# Patient Record
Sex: Male | Born: 1950 | Race: White | Hispanic: No | Smoking: Light tobacco smoker
Health system: Southern US, Community
[De-identification: ages and names within clinical notes are randomized; demographics above are authoritative.]

## PROBLEM LIST (undated history)

## (undated) DIAGNOSIS — G4733 Obstructive sleep apnea (adult) (pediatric): Secondary | ICD-10-CM

## (undated) DIAGNOSIS — E785 Hyperlipidemia, unspecified: Secondary | ICD-10-CM

## (undated) DIAGNOSIS — C4491 Basal cell carcinoma of skin, unspecified: Secondary | ICD-10-CM

## (undated) DIAGNOSIS — E119 Type 2 diabetes mellitus without complications: Secondary | ICD-10-CM

## (undated) DIAGNOSIS — Z9989 Dependence on other enabling machines and devices: Secondary | ICD-10-CM

## (undated) DIAGNOSIS — I10 Essential (primary) hypertension: Secondary | ICD-10-CM

## (undated) DIAGNOSIS — E669 Obesity, unspecified: Secondary | ICD-10-CM

## (undated) DIAGNOSIS — J45909 Unspecified asthma, uncomplicated: Secondary | ICD-10-CM

## (undated) DIAGNOSIS — K219 Gastro-esophageal reflux disease without esophagitis: Secondary | ICD-10-CM

## (undated) HISTORY — PX: BASAL CELL CARCINOMA EXCISION: SHX1214

## (undated) HISTORY — DX: Unspecified asthma, uncomplicated: J45.909

---

## 2011-10-10 ENCOUNTER — Emergency Department (INDEPENDENT_AMBULATORY_CARE_PROVIDER_SITE_OTHER): Payer: Federal, State, Local not specified - PPO

## 2011-10-10 ENCOUNTER — Emergency Department (HOSPITAL_BASED_OUTPATIENT_CLINIC_OR_DEPARTMENT_OTHER)
Admission: EM | Admit: 2011-10-10 | Discharge: 2011-10-10 | Disposition: A | Discharge status: Home / Self Care | Payer: Federal, State, Local not specified - PPO | Attending: Emergency Medicine | Admitting: Emergency Medicine

## 2011-10-10 ENCOUNTER — Encounter (HOSPITAL_BASED_OUTPATIENT_CLINIC_OR_DEPARTMENT_OTHER): Payer: Self-pay | Admitting: *Deleted

## 2011-10-10 DIAGNOSIS — J45909 Unspecified asthma, uncomplicated: Secondary | ICD-10-CM | POA: Insufficient documentation

## 2011-10-10 DIAGNOSIS — Z85828 Personal history of other malignant neoplasm of skin: Secondary | ICD-10-CM | POA: Insufficient documentation

## 2011-10-10 DIAGNOSIS — K219 Gastro-esophageal reflux disease without esophagitis: Secondary | ICD-10-CM | POA: Insufficient documentation

## 2011-10-10 DIAGNOSIS — I10 Essential (primary) hypertension: Secondary | ICD-10-CM | POA: Insufficient documentation

## 2011-10-10 DIAGNOSIS — R062 Wheezing: Secondary | ICD-10-CM

## 2011-10-10 DIAGNOSIS — R0602 Shortness of breath: Secondary | ICD-10-CM

## 2011-10-10 HISTORY — DX: Essential (primary) hypertension: I10

## 2011-10-10 HISTORY — DX: Gastro-esophageal reflux disease without esophagitis: K21.9

## 2011-10-10 MED ORDER — DOXYCYCLINE MONOHYDRATE 150 MG PO CAPS: 1.0000 | ORAL_CAPSULE | Freq: Two times a day (BID) | ORAL | Status: DC

## 2011-10-10 MED ORDER — PREDNISONE 10 MG PO TABS: 20.0000 mg | ORAL_TABLET | Freq: Every day | ORAL | Status: DC

## 2011-10-10 MED ORDER — ALBUTEROL SULFATE (5 MG/ML) 0.5% IN NEBU
INHALATION_SOLUTION | RESPIRATORY_TRACT | Status: AC
  Administered 2011-10-10: 5 mg
  Filled 2011-10-10: qty 1

## 2011-10-10 MED ORDER — IPRATROPIUM BROMIDE 0.02 % IN SOLN
RESPIRATORY_TRACT | Status: AC
  Administered 2011-10-10: 0.5 mg
  Filled 2011-10-10: qty 2.5

## 2011-10-10 MED ORDER — ALBUTEROL SULFATE HFA 108 (90 BASE) MCG/ACT IN AERS
INHALATION_SPRAY | RESPIRATORY_TRACT | Status: AC
  Administered 2011-10-10: 2 via RESPIRATORY_TRACT
  Filled 2011-10-10: qty 6.7

## 2011-10-10 MED ORDER — PREDNISONE 50 MG PO TABS
60.0000 mg | ORAL_TABLET | Freq: Once | ORAL | Status: AC
  Administered 2011-10-10: 60 mg via ORAL
  Filled 2011-10-10: qty 1

## 2011-10-10 MED ORDER — DOXYCYCLINE HYCLATE 100 MG PO TABS
100.0000 mg | ORAL_TABLET | Freq: Once | ORAL | Status: AC
  Administered 2011-10-10: 100 mg via ORAL
  Filled 2011-10-10: qty 1

## 2011-10-10 NOTE — ED Notes (Signed)
Pt reports sick since Tuesday with fever, cough, wheezing and SOB- air travel x 2 in past week- uses cpap at night

## 2011-10-10 NOTE — ED Provider Notes (Signed)
History   This chart was scribed for Hilario Quarry, MD by Charolett Bumpers . The patient was seen in room MH05/MH05.     CSN: [id number]  Arrival date & time 10/10/11  1525   First MD Initiated Contact with Patient 10/10/11 1551      Chief Complaint  Patient presents with  . Shortness of Breath    (Consider location/radiation/quality/duration/timing/severity/associated sxs/prior treatment) HPI Tommy Parker is a 61 y.o. male who presents to the Emergency Department complaining of constant, moderate wheezing with associated cough, and fever for the past week. Patient reports that he has travel by air X2 in the past week as well. Patient denies smoking or a h/o smoking. Patient reports a h/o wheezing. Patient states that he has a productive cough with yellow sputum.Patient denies any h/o blood clots. Patient reports a h/o HTN and sleep apnea in which he uses a CPAP at night. Patient received a breathing treatment here in ED and patient denies any change.    PCP: Dr. Tommi Emery  Past Medical History  Diagnosis Date  . Hypertension   . Skin cancer   . Acid reflux   . Sleep apnea     History reviewed. No pertinent past surgical history.  History reviewed. No pertinent family history.  History  Substance Use Topics  . Smoking status: Current Everyday Smoker -- 0 years    Types: Cigars  . Smokeless tobacco: Current User    Types: Chew  . Alcohol Use: Not on file      Review of Systems  Constitutional: Positive for fever.  Respiratory: Positive for cough and wheezing.   Cardiovascular: Negative for chest pain.  Gastrointestinal: Negative for nausea and vomiting.  All other systems reviewed and are negative.    Allergies  Review of patient's allergies indicates no known allergies.  Home Medications   Current Outpatient Rx  Name Route Sig Dispense Refill  . CITALOPRAM HYDROBROMIDE 20 MG PO TABS Oral Take 20 mg by mouth daily.    Marland Kitchen LANSOPRAZOLE 15 MG PO CPDR Oral  Take 15 mg by mouth daily.    Marland Kitchen LISINOPRIL-HYDROCHLOROTHIAZIDE 20-12.5 MG PO TABS Oral Take 1 tablet by mouth daily.    Marland Kitchen MONTELUKAST SODIUM 10 MG PO TABS Oral Take 10 mg by mouth at bedtime.    Marland Kitchen NADOLOL 40 MG PO TABS Oral Take 40 mg by mouth daily.    Marland Kitchen NALTREXONE HCL 50 MG PO TABS Oral Take 50 mg by mouth daily.    Marland Kitchen FISH OIL PO Oral Take 1 capsule by mouth daily.      BP 130/85  Pulse 75  Temp(Src) 98.3 F (36.8 C) (Oral)  Resp 20  Ht 5\' 7"  (1.702 m)  Wt 200 lb (90.719 kg)  BMI 31.32 kg/m2  SpO2 96%  Physical Exam  Nursing note and vitals reviewed. Constitutional: He is oriented to person, place, and time. He appears well-developed and well-nourished. No distress.  HENT:  Head: Normocephalic and atraumatic.  Right Ear: External ear normal.  Left Ear: External ear normal.  Nose: Nose normal.  Eyes: EOM are normal. Pupils are equal, round, and reactive to light.  Neck: Normal range of motion. Neck supple. No tracheal deviation present.  Cardiovascular: Normal rate, regular rhythm and normal heart sounds.   Pulmonary/Chest: Effort normal. No respiratory distress. He has wheezes.       Expiratory wheezing.   Abdominal: Soft. Bowel sounds are normal. He exhibits no distension. There is no tenderness.  Musculoskeletal:  Normal range of motion. He exhibits no edema.  Neurological: He is alert and oriented to person, place, and time. No sensory deficit.  Skin: Skin is warm and dry.  Psychiatric: He has a normal mood and affect. His behavior is normal.    ED Course  Procedures (including critical care time)  DIAGNOSTIC STUDIES: Oxygen Saturation is 97% on room air, normal by my interpretation.    COORDINATION OF CARE:  1538: Medication Orders: Ipratropium (Atrovent) 0.02% nebulizer solution; Albuterol (Proventil) (5 mg/mL) 0.5% nebulizer solution.  1610: Discussed planned course of treatment with the patient who is agreeable at this time.  1624: Medication Orders: Albuterol  (Proventil) (5 mg/mL) 0.5% nebulizer solution.  1630: Medication Orders: Prednisone (Deltasone) tablet 60 mg-once; Doxycycline (Vibra-tabs) tablet 100 mg-once.   Labs Reviewed - No data to display Dg Chest 2 View  10/10/2011  *RADIOLOGY REPORT*  Clinical Data: Short of breath, wheezing  CHEST - 2 VIEW  Comparison: Chest radiograph 05/31/2007  Findings: Normal mediastinum and cardiac silhouette.  Normal pulmonary  vasculature.  No evidence of effusion, infiltrate, or pneumothorax.  No acute bony abnormality.  Mild bronchitic change centrally is similar to prior.  Calcified granuloma in the right upper lobe is unchanged from prior.  IMPRESSION: No acute cardiopulmonary process.  Original Report Authenticated By: Genevive Bi, M.D.     No diagnosis found.    MDM  I personally performed the services described in this documentation, which was scribed in my presence. The recorded information has been reviewed and considered.       Hilario Quarry, MD 10/10/11 [phone]

## 2011-10-10 NOTE — Discharge Instructions (Signed)

## 2013-01-02 ENCOUNTER — Other Ambulatory Visit (HOSPITAL_BASED_OUTPATIENT_CLINIC_OR_DEPARTMENT_OTHER): Payer: Self-pay | Admitting: Family Medicine

## 2013-01-02 ENCOUNTER — Ambulatory Visit (HOSPITAL_BASED_OUTPATIENT_CLINIC_OR_DEPARTMENT_OTHER)
Admission: RE | Admit: 2013-01-02 | Discharge: 2013-01-02 | Disposition: A | Discharge status: Home / Self Care | Payer: Federal, State, Local not specified - PPO | Source: Ambulatory Visit | Attending: Family Medicine | Admitting: Family Medicine

## 2013-01-02 DIAGNOSIS — M719 Bursopathy, unspecified: Secondary | ICD-10-CM | POA: Insufficient documentation

## 2013-01-02 DIAGNOSIS — M25511 Pain in right shoulder: Secondary | ICD-10-CM

## 2013-01-02 DIAGNOSIS — M24119 Other articular cartilage disorders, unspecified shoulder: Secondary | ICD-10-CM | POA: Insufficient documentation

## 2013-01-02 DIAGNOSIS — M25419 Effusion, unspecified shoulder: Secondary | ICD-10-CM | POA: Insufficient documentation

## 2013-01-02 DIAGNOSIS — S43439A Superior glenoid labrum lesion of unspecified shoulder, initial encounter: Secondary | ICD-10-CM | POA: Insufficient documentation

## 2013-01-02 DIAGNOSIS — X58XXXA Exposure to other specified factors, initial encounter: Secondary | ICD-10-CM | POA: Insufficient documentation

## 2013-01-02 DIAGNOSIS — M949 Disorder of cartilage, unspecified: Secondary | ICD-10-CM | POA: Insufficient documentation

## 2013-01-02 DIAGNOSIS — M899 Disorder of bone, unspecified: Secondary | ICD-10-CM | POA: Insufficient documentation

## 2013-01-02 DIAGNOSIS — M75100 Unspecified rotator cuff tear or rupture of unspecified shoulder, not specified as traumatic: Secondary | ICD-10-CM | POA: Insufficient documentation

## 2013-01-02 DIAGNOSIS — S46819A Strain of other muscles, fascia and tendons at shoulder and upper arm level, unspecified arm, initial encounter: Secondary | ICD-10-CM | POA: Insufficient documentation

## 2013-01-02 DIAGNOSIS — IMO0002 Reserved for concepts with insufficient information to code with codable children: Secondary | ICD-10-CM | POA: Insufficient documentation

## 2013-01-02 DIAGNOSIS — M19019 Primary osteoarthritis, unspecified shoulder: Secondary | ICD-10-CM | POA: Insufficient documentation

## 2013-01-02 DIAGNOSIS — M67919 Unspecified disorder of synovium and tendon, unspecified shoulder: Secondary | ICD-10-CM | POA: Insufficient documentation

## 2013-03-30 ENCOUNTER — Ambulatory Visit: Payer: Self-pay

## 2013-05-02 ENCOUNTER — Ambulatory Visit: Payer: Self-pay

## 2013-05-31 HISTORY — PX: CATARACT EXTRACTION W/ INTRAOCULAR LENS  IMPLANT, BILATERAL: SHX1307

## 2013-08-09 ENCOUNTER — Other Ambulatory Visit: Payer: Self-pay | Admitting: Physician Assistant

## 2013-08-09 ENCOUNTER — Ambulatory Visit
Admission: RE | Admit: 2013-08-09 | Discharge: 2013-08-09 | Disposition: A | Discharge status: Home / Self Care | Payer: Federal, State, Local not specified - PPO | Source: Ambulatory Visit | Attending: Physician Assistant | Admitting: Physician Assistant

## 2013-08-09 DIAGNOSIS — R109 Unspecified abdominal pain: Secondary | ICD-10-CM

## 2013-08-09 DIAGNOSIS — R509 Fever, unspecified: Secondary | ICD-10-CM

## 2013-08-09 DIAGNOSIS — K921 Melena: Secondary | ICD-10-CM

## 2013-08-09 MED ORDER — IOHEXOL 300 MG/ML  SOLN
125.0000 mL | Freq: Once | INTRAMUSCULAR | Status: AC | PRN
  Administered 2013-08-09: 125 mL via INTRAVENOUS

## 2014-02-21 ENCOUNTER — Other Ambulatory Visit: Payer: Self-pay | Admitting: Physician Assistant

## 2014-02-21 DIAGNOSIS — R109 Unspecified abdominal pain: Secondary | ICD-10-CM

## 2014-02-22 ENCOUNTER — Ambulatory Visit (INDEPENDENT_AMBULATORY_CARE_PROVIDER_SITE_OTHER): Payer: Federal, State, Local not specified - PPO

## 2014-02-22 DIAGNOSIS — R911 Solitary pulmonary nodule: Secondary | ICD-10-CM

## 2014-02-22 DIAGNOSIS — R109 Unspecified abdominal pain: Secondary | ICD-10-CM

## 2014-02-22 DIAGNOSIS — K7689 Other specified diseases of liver: Secondary | ICD-10-CM

## 2014-02-22 MED ORDER — IOHEXOL 300 MG/ML  SOLN
100.0000 mL | Freq: Once | INTRAMUSCULAR | Status: AC | PRN
  Administered 2014-02-22: 100 mL via INTRAVENOUS

## 2014-02-24 ENCOUNTER — Emergency Department (HOSPITAL_COMMUNITY)
Admission: EM | Admit: 2014-02-24 | Discharge: 2014-02-25 | Disposition: A | Discharge status: Home / Self Care | Payer: Federal, State, Local not specified - PPO | Attending: Emergency Medicine | Admitting: Emergency Medicine

## 2014-02-24 ENCOUNTER — Encounter (HOSPITAL_COMMUNITY): Payer: Self-pay | Admitting: Emergency Medicine

## 2014-02-24 ENCOUNTER — Emergency Department (HOSPITAL_COMMUNITY): Payer: Federal, State, Local not specified - PPO

## 2014-02-24 DIAGNOSIS — Z792 Long term (current) use of antibiotics: Secondary | ICD-10-CM | POA: Insufficient documentation

## 2014-02-24 DIAGNOSIS — J45909 Unspecified asthma, uncomplicated: Secondary | ICD-10-CM | POA: Insufficient documentation

## 2014-02-24 DIAGNOSIS — K219 Gastro-esophageal reflux disease without esophagitis: Secondary | ICD-10-CM | POA: Insufficient documentation

## 2014-02-24 DIAGNOSIS — Z85828 Personal history of other malignant neoplasm of skin: Secondary | ICD-10-CM | POA: Diagnosis not present

## 2014-02-24 DIAGNOSIS — E119 Type 2 diabetes mellitus without complications: Secondary | ICD-10-CM | POA: Diagnosis not present

## 2014-02-24 DIAGNOSIS — F17200 Nicotine dependence, unspecified, uncomplicated: Secondary | ICD-10-CM | POA: Diagnosis not present

## 2014-02-24 DIAGNOSIS — IMO0002 Reserved for concepts with insufficient information to code with codable children: Secondary | ICD-10-CM | POA: Insufficient documentation

## 2014-02-24 DIAGNOSIS — K8000 Calculus of gallbladder with acute cholecystitis without obstruction: Secondary | ICD-10-CM | POA: Diagnosis not present

## 2014-02-24 DIAGNOSIS — R1011 Right upper quadrant pain: Secondary | ICD-10-CM | POA: Insufficient documentation

## 2014-02-24 DIAGNOSIS — K8020 Calculus of gallbladder without cholecystitis without obstruction: Secondary | ICD-10-CM | POA: Diagnosis not present

## 2014-02-24 DIAGNOSIS — Z79899 Other long term (current) drug therapy: Secondary | ICD-10-CM | POA: Insufficient documentation

## 2014-02-24 DIAGNOSIS — I10 Essential (primary) hypertension: Secondary | ICD-10-CM | POA: Insufficient documentation

## 2014-02-24 LAB — COMPREHENSIVE METABOLIC PANEL
ALBUMIN: 3.6 g/dL (ref 3.5–5.2)
ALK PHOS: 88 U/L (ref 39–117)
ALT: 74 U/L — ABNORMAL HIGH (ref 0–53)
AST: 43 U/L — AB (ref 0–37)
Anion gap: 14 (ref 5–15)
BILIRUBIN TOTAL: 0.3 mg/dL (ref 0.3–1.2)
BUN: 15 mg/dL (ref 6–23)
CHLORIDE: 100 meq/L (ref 96–112)
CO2: 24 mEq/L (ref 19–32)
Calcium: 9.3 mg/dL (ref 8.4–10.5)
Creatinine, Ser: 1.13 mg/dL (ref 0.50–1.35)
GFR calc Af Amer: 79 mL/min — ABNORMAL LOW (ref >90)
GFR calc non Af Amer: 68 mL/min — ABNORMAL LOW (ref >90)
Glucose, Bld: 192 mg/dL — ABNORMAL HIGH (ref 70–99)
POTASSIUM: 4.4 meq/L (ref 3.7–5.3)
Sodium: 138 mEq/L (ref 137–147)
Total Protein: 8.1 g/dL (ref 6.0–8.3)

## 2014-02-24 LAB — CBC WITH DIFFERENTIAL/PLATELET
BASOS ABS: 0 10*3/uL (ref 0.0–0.1)
BASOS PCT: 0 % (ref 0–1)
Eosinophils Absolute: 0.2 10*3/uL (ref 0.0–0.7)
Eosinophils Relative: 2 % (ref 0–5)
HCT: 44.4 % (ref 39.0–52.0)
HEMOGLOBIN: 15.1 g/dL (ref 13.0–17.0)
LYMPHS PCT: 38 % (ref 12–46)
Lymphs Abs: 4.2 10*3/uL — ABNORMAL HIGH (ref 0.7–4.0)
MCH: 31.3 pg (ref 26.0–34.0)
MCHC: 34 g/dL (ref 30.0–36.0)
MCV: 91.9 fL (ref 78.0–100.0)
MONOS PCT: 9 % (ref 3–12)
Monocytes Absolute: 1 10*3/uL (ref 0.1–1.0)
NEUTROS ABS: 5.5 10*3/uL (ref 1.7–7.7)
NEUTROS PCT: 50 % (ref 43–77)
Platelets: 246 10*3/uL (ref 150–400)
RBC: 4.83 MIL/uL (ref 4.22–5.81)
RDW: 12.2 % (ref 11.5–15.5)
WBC: 11 10*3/uL — AB (ref 4.0–10.5)

## 2014-02-24 LAB — LIPASE, BLOOD: Lipase: 55 U/L (ref 11–59)

## 2014-02-24 MED ORDER — OXYCODONE-ACETAMINOPHEN 5-325 MG PO TABS
2.0000 | ORAL_TABLET | Freq: Once | ORAL | Status: AC
  Administered 2014-02-24: 2 via ORAL
  Filled 2014-02-24: qty 2

## 2014-02-24 MED ORDER — OXYCODONE-ACETAMINOPHEN 5-325 MG PO TABS: 2.0000 | ORAL_TABLET | Freq: Four times a day (QID) | ORAL | Status: DC | PRN

## 2014-02-24 MED ORDER — ONDANSETRON HCL 4 MG/2ML IJ SOLN
4.0000 mg | Freq: Once | INTRAMUSCULAR | Status: AC
  Administered 2014-02-24: 4 mg via INTRAVENOUS
  Filled 2014-02-24: qty 2

## 2014-02-24 MED ORDER — MORPHINE SULFATE 4 MG/ML IJ SOLN
4.0000 mg | Freq: Once | INTRAMUSCULAR | Status: AC
  Administered 2014-02-24: 4 mg via INTRAVENOUS
  Filled 2014-02-24: qty 1

## 2014-02-24 NOTE — ED Notes (Signed)
Patient returned from Ultrasound. 

## 2014-02-24 NOTE — ED Notes (Signed)
Pt reports last BM was yesterday. Pt states he normally has one everyday.

## 2014-02-24 NOTE — Discharge Instructions (Signed)

## 2014-02-24 NOTE — ED Provider Notes (Signed)
CSN: [id number]     Arrival date & time 02/24/14  2033 History   First MD Initiated Contact with Patient 02/24/14 2111     Chief Complaint  Patient presents with  . Abdominal Pain     (Consider location/radiation/quality/duration/timing/severity/associated sxs/prior Treatment) HPI Comments: Patient presents emergency department with chief complaint of right upper quadrant abdominal pain. He states the pain started a week ago. States that he was seen by his primary care provider, and had a CT scan performed. There was some concern of gallbladder inflammation, but no evidence of acute cholecystitis. Patient was prescribed tramadol, which she has been taking with no relief of his pain. He states pain is worsening. He denies any fevers, chills, nausea, vomiting, or diarrhea. There no aggravating or alleviating factors. Patient denies any history of abdominal surgery. He states that his pain is severe, so he came for reevaluation.  The history is provided by the patient. No language interpreter was used.    Past Medical History  Diagnosis Date  . Hypertension   . Skin cancer   . Acid reflux   . Sleep apnea   . Diabetes mellitus without complication   . Asthma    History reviewed. No pertinent past surgical history. History reviewed. No pertinent family history. History  Substance Use Topics  . Smoking status: Light Tobacco Smoker -- 0 years    Types: Cigars  . Smokeless tobacco: Current User    Types: Chew  . Alcohol Use: No    Review of Systems  All other systems reviewed and are negative.     Allergies  Review of patient's allergies indicates no known allergies.  Home Medications   Prior to Admission medications   Medication Sig Start Date End Date Taking? Authorizing Provider  citalopram (CELEXA) 20 MG tablet Take 20 mg by mouth daily.    Historical Provider, MD  Doxycycline Monohydrate 150 MG CAPS Take 1 capsule (150 mg total) by mouth 2 (two) times daily. 10/10/11    Shaune Pollack, MD  lansoprazole (PREVACID) 15 MG capsule Take 15 mg by mouth daily.    Historical Provider, MD  lisinopril-hydrochlorothiazide (PRINZIDE,ZESTORETIC) 20-12.5 MG per tablet Take 1 tablet by mouth daily.    Historical Provider, MD  montelukast (SINGULAIR) 10 MG tablet Take 10 mg by mouth at bedtime.    Historical Provider, MD  nadolol (CORGARD) 40 MG tablet Take 40 mg by mouth daily.    Historical Provider, MD  naltrexone (DEPADE) 50 MG tablet Take 50 mg by mouth daily.    Historical Provider, MD  Omega-3 Fatty Acids (FISH OIL PO) Take 1 capsule by mouth daily.    Historical Provider, MD  predniSONE (DELTASONE) 10 MG tablet Take 2 tablets (20 mg total) by mouth daily. 10/10/11   Shaune Pollack, MD   BP 165/83  Pulse 97  Temp(Src) 98.6 F (37 C) (Oral)  Resp 18  SpO2 93% Physical Exam  Nursing note and vitals reviewed. Constitutional: He is oriented to person, place, and time. He appears well-developed and well-nourished.  HENT:  Head: Normocephalic and atraumatic.  Eyes: Conjunctivae and EOM are normal. Pupils are equal, round, and reactive to light. Right eye exhibits no discharge. Left eye exhibits no discharge. No scleral icterus.  Neck: Normal range of motion. Neck supple. No JVD present.  Cardiovascular: Normal rate, regular rhythm and normal heart sounds.  Exam reveals no gallop and no friction rub.   No murmur heard. Pulmonary/Chest: Effort normal and breath sounds normal. No  respiratory distress. He has no wheezes. He has no rales. He exhibits no tenderness.  Abdominal: Soft. He exhibits no distension and no mass. There is tenderness. There is no rebound and no guarding.  Right upper quadrant abdominal tenderness to palpation, no Murphy's sign, no left-sided abdominal tenderness, no lower abdominal tenderness  Musculoskeletal: Normal range of motion. He exhibits no edema and no tenderness.  Neurological: He is alert and oriented to person, place, and time.  Skin:  Skin is warm and dry.  Psychiatric: He has a normal mood and affect. His behavior is normal. Judgment and thought content normal.    ED Course  Procedures (including critical care time) Results for orders placed during the hospital encounter of 02/24/14  CBC WITH DIFFERENTIAL      Result Value Ref Range   WBC 11.0 (*) 4.0 - 10.5 K/uL   RBC 4.83  4.22 - 5.81 MIL/uL   Hemoglobin 15.1  13.0 - 17.0 g/dL   HCT 44.4  39.0 - 52.0 %   MCV 91.9  78.0 - 100.0 fL   MCH 31.3  26.0 - 34.0 pg   MCHC 34.0  30.0 - 36.0 g/dL   RDW 12.2  11.5 - 15.5 %   Platelets 246  150 - 400 K/uL   Neutrophils Relative % 50  43 - 77 %   Neutro Abs 5.5  1.7 - 7.7 K/uL   Lymphocytes Relative 38  12 - 46 %   Lymphs Abs 4.2 (*) 0.7 - 4.0 K/uL   Monocytes Relative 9  3 - 12 %   Monocytes Absolute 1.0  0.1 - 1.0 K/uL   Eosinophils Relative 2  0 - 5 %   Eosinophils Absolute 0.2  0.0 - 0.7 K/uL   Basophils Relative 0  0 - 1 %   Basophils Absolute 0.0  0.0 - 0.1 K/uL  COMPREHENSIVE METABOLIC PANEL      Result Value Ref Range   Sodium 138  137 - 147 mEq/L   Potassium 4.4  3.7 - 5.3 mEq/L   Chloride 100  96 - 112 mEq/L   CO2 24  19 - 32 mEq/L   Glucose, Bld 192 (*) 70 - 99 mg/dL   BUN 15  6 - 23 mg/dL   Creatinine, Ser 1.13  0.50 - 1.35 mg/dL   Calcium 9.3  8.4 - 10.5 mg/dL   Total Protein 8.1  6.0 - 8.3 g/dL   Albumin 3.6  3.5 - 5.2 g/dL   AST 43 (*) 0 - 37 U/L   ALT 74 (*) 0 - 53 U/L   Alkaline Phosphatase 88  39 - 117 U/L   Total Bilirubin 0.3  0.3 - 1.2 mg/dL   GFR calc non Af Amer 68 (*) >90 mL/min   GFR calc Af Amer 79 (*) >90 mL/min   Anion gap 14  5 - 15  LIPASE, BLOOD      Result Value Ref Range   Lipase 55  11 - 59 U/L   US Abdomen Complete  02/24/2014   CLINICAL DATA:  Right upper quadrant pain.  EXAM: ULTRASOUND ABDOMEN COMPLETE  COMPARISON:  CT abdomen and pelvis 02/22/2014  FINDINGS: Gallbladder:  Diffuse echogenic material demonstrated in the gallbladder consistent with sludge and small  stones. No gallbladder wall thickening. Murphy's sign is negative.  Common bile duct:  Diameter: 6.5 mm, upper limits of normal.  Liver:  Diffusely increased hepatic echotexture suggesting mild fatty infiltration. No focal lesions.  IVC:  No  abnormality visualized.  Pancreas:  Limited visualization.  Visualized portion appears unremarkable.  Spleen:  Size and appearance within normal limits.  Right Kidney:  Length: 11.3 cm. Echogenicity within normal limits. No mass or hydronephrosis visualized.  Left Kidney:  Length: 12.2 cm. Echogenicity within normal limits. No mass or hydronephrosis visualized.  Abdominal aorta:  No aneurysm visualized.  Other findings:  None.  IMPRESSION: Sludge and small stones in the gallbladder. No gallbladder wall thickening. Murphy's sign negative.   Electronically Signed   By: Lucienne Capers M.D.   On: 02/24/2014 23:14   Ct Abdomen Pelvis W Contrast  02/22/2014   CLINICAL DATA:  Several day history of abdominal pain. Past medical history of pancreatitis.  EXAM: CT ABDOMEN AND PELVIS WITH CONTRAST  TECHNIQUE: Multidetector CT imaging of the abdomen and pelvis was performed using the standard protocol following bolus administration of intravenous contrast.  CONTRAST:  115mL OMNIPAQUE IOHEXOL 300 MG/ML  SOLN  COMPARISON:  Prior CT abdomen/ pelvis 08/09/2013  FINDINGS: Lower Chest: 3 mm subpleural pulmonary nodules in the right lower lobe (5 and 6 of series 3) remain unchanged dating back to March of 2015. Otherwise, the visualized lower lungs are clear. Cardiac structures within normal limits for size. Unremarkable distal thoracic esophagus.  Abdomen: Unremarkable CT appearance of the stomach, duodenum, spleen, adrenal glands and pancreas. Normal hepatic contour and morphology. Diffuse hypoattenuation of the hepatic parenchyma with slight sparing adjacent to the gallbladder fossa consistent with hepatic steatosis. The gallbladder is mildly distended at 5.1 cm in diameter. Suggestion of  high attenuation in the gallbladder neck raises the possibility of stones. No definite radiopaque stones are identified. No intrahepatic biliary ductal dilatation.  Unremarkable appearance of the bilateral kidneys. No focal solid lesion, hydronephrosis or nephrolithiasis. No evidence of obstruction or focal bowel wall thickening. Normal appendix in the right lower quadrant. The terminal ileum is unremarkable. Moderate volume of formed stool throughout the colon. No free fluid or suspicious adenopathy.  Pelvis: Unremarkable bladder, prostate gland and seminal vesicles. No free fluid or suspicious adenopathy.  Bones/Soft Tissues: No acute fracture or aggressive appearing lytic or blastic osseous lesion.  Vascular: No significant atherosclerotic vascular disease, aneurysmal dilatation or acute abnormality.  IMPRESSION: 1. Nonspecific distension of the gallbladder without definite gallbladder wall thickening or pericholecystic fluid. No radiopaque cholelithiasis identified although there is slight increased density in the gallbladder neck suggesting the presence of underlying stones. If the patient's pain localizes to the right upper quadrant, consider further evaluation with abdominal ultrasound to assess for possible acute or chronic cholecystitis. 2. Moderate volume of retained stool throughout the colon. Query clinical history of constipation? 3. Hepatic steatosis. 4. Stable tiny (up to 3 mm) subpleural pulmonary nodules dating back to at least March of 2015. While multiplicity and 6 month stability is highly suggestive of a benign process, a at least 1 additional followup CT scan is recommended to ensure stability. Recommend repeat CT scan of the chest in 1 year.   Electronically Signed   By: Jacqulynn Cadet M.D.   On: 02/22/2014 10:27      EKG Interpretation None      MDM   Final diagnoses:  Right upper quadrant pain    Patient with right upper quadrant abdominal pain that started one week ago.  Seen by PCP, and had CT scan ordered. CT results remarkable for some nonspecific gallbladder distention. Patient was treated with pain medicine, and encouraged to followup next week. Patient states that his pain has remained, and  possibly slightly worsened. We'll check labs, an ultrasound.  Ultrasound is negative for cholecystitis. Will treat patient's pain, and discharged to home with PCP/surgery followup. Patient understands and agrees with plan. He is stable and ready for discharge. Pain improved in the ED.    Montine Circle, PA-C 02/24/14 2353

## 2014-02-24 NOTE — ED Notes (Signed)
Pt c/o generalized abdominal pain since last monday. Pt states he was seen by his PCP, had a CT scan on Friday, pt was told pain is probably caused by gall stones, PCP notify patient sometime this week regarding results. Pt was prescribed Tramadol by PCP, Pt's pain in unrelieved with pain prescription. Pt denies n/v/d. Denies fever at home.

## 2014-02-25 ENCOUNTER — Encounter (HOSPITAL_COMMUNITY): Payer: Self-pay | Admitting: Emergency Medicine

## 2014-02-25 ENCOUNTER — Inpatient Hospital Stay (HOSPITAL_COMMUNITY)
Admission: EM | Admit: 2014-02-25 | Discharge: 2014-02-28 | DRG: 419 | Disposition: A | Discharge status: Home / Self Care | Payer: Federal, State, Local not specified - PPO | Attending: General Surgery | Admitting: General Surgery

## 2014-02-25 DIAGNOSIS — K219 Gastro-esophageal reflux disease without esophagitis: Secondary | ICD-10-CM | POA: Diagnosis present

## 2014-02-25 DIAGNOSIS — Z85828 Personal history of other malignant neoplasm of skin: Secondary | ICD-10-CM

## 2014-02-25 DIAGNOSIS — Z794 Long term (current) use of insulin: Secondary | ICD-10-CM

## 2014-02-25 DIAGNOSIS — F17220 Nicotine dependence, chewing tobacco, uncomplicated: Secondary | ICD-10-CM | POA: Diagnosis present

## 2014-02-25 DIAGNOSIS — J45909 Unspecified asthma, uncomplicated: Secondary | ICD-10-CM | POA: Diagnosis present

## 2014-02-25 DIAGNOSIS — G4730 Sleep apnea, unspecified: Secondary | ICD-10-CM

## 2014-02-25 DIAGNOSIS — K8000 Calculus of gallbladder with acute cholecystitis without obstruction: Principal | ICD-10-CM | POA: Diagnosis present

## 2014-02-25 DIAGNOSIS — F17290 Nicotine dependence, other tobacco product, uncomplicated: Secondary | ICD-10-CM | POA: Diagnosis present

## 2014-02-25 DIAGNOSIS — E785 Hyperlipidemia, unspecified: Secondary | ICD-10-CM

## 2014-02-25 DIAGNOSIS — E119 Type 2 diabetes mellitus without complications: Secondary | ICD-10-CM

## 2014-02-25 DIAGNOSIS — I10 Essential (primary) hypertension: Secondary | ICD-10-CM

## 2014-02-25 DIAGNOSIS — E669 Obesity, unspecified: Secondary | ICD-10-CM

## 2014-02-25 DIAGNOSIS — K8020 Calculus of gallbladder without cholecystitis without obstruction: Secondary | ICD-10-CM | POA: Diagnosis present

## 2014-02-25 HISTORY — DX: Basal cell carcinoma of skin, unspecified: C44.91

## 2014-02-25 HISTORY — DX: Obstructive sleep apnea (adult) (pediatric): G47.33

## 2014-02-25 HISTORY — DX: Dependence on other enabling machines and devices: Z99.89

## 2014-02-25 HISTORY — DX: Obesity, unspecified: E66.9

## 2014-02-25 HISTORY — DX: Type 2 diabetes mellitus without complications: E11.9

## 2014-02-25 HISTORY — DX: Hyperlipidemia, unspecified: E78.5

## 2014-02-25 LAB — CBC WITH DIFFERENTIAL/PLATELET
BASOS PCT: 0 % (ref 0–1)
Basophils Absolute: 0 10*3/uL (ref 0.0–0.1)
EOS ABS: 0.2 10*3/uL (ref 0.0–0.7)
EOS PCT: 3 % (ref 0–5)
HEMATOCRIT: 42.7 % (ref 39.0–52.0)
HEMOGLOBIN: 14.6 g/dL (ref 13.0–17.0)
LYMPHS ABS: 2.6 10*3/uL (ref 0.7–4.0)
Lymphocytes Relative: 30 % (ref 12–46)
MCH: 31.5 pg (ref 26.0–34.0)
MCHC: 34.2 g/dL (ref 30.0–36.0)
MCV: 92 fL (ref 78.0–100.0)
MONO ABS: 0.9 10*3/uL (ref 0.1–1.0)
MONOS PCT: 10 % (ref 3–12)
Neutro Abs: 5.1 10*3/uL (ref 1.7–7.7)
Neutrophils Relative %: 57 % (ref 43–77)
Platelets: 212 10*3/uL (ref 150–400)
RBC: 4.64 MIL/uL (ref 4.22–5.81)
RDW: 12.5 % (ref 11.5–15.5)
WBC: 8.8 10*3/uL (ref 4.0–10.5)

## 2014-02-25 LAB — COMPREHENSIVE METABOLIC PANEL
ALK PHOS: 105 U/L (ref 39–117)
ALT: 72 U/L — ABNORMAL HIGH (ref 0–53)
ANION GAP: 13 (ref 5–15)
AST: 46 U/L — ABNORMAL HIGH (ref 0–37)
Albumin: 3.3 g/dL — ABNORMAL LOW (ref 3.5–5.2)
BUN: 13 mg/dL (ref 6–23)
CALCIUM: 9.1 mg/dL (ref 8.4–10.5)
CO2: 24 mEq/L (ref 19–32)
CREATININE: 0.85 mg/dL (ref 0.50–1.35)
Chloride: 100 mEq/L (ref 96–112)
GFR calc non Af Amer: >90 mL/min (ref >90)
GLUCOSE: 224 mg/dL — AB (ref 70–99)
POTASSIUM: 4.1 meq/L (ref 3.7–5.3)
Sodium: 137 mEq/L (ref 137–147)
TOTAL PROTEIN: 7.8 g/dL (ref 6.0–8.3)
Total Bilirubin: 0.4 mg/dL (ref 0.3–1.2)

## 2014-02-25 LAB — LIPASE, BLOOD: LIPASE: 44 U/L (ref 11–59)

## 2014-02-25 MED ORDER — POTASSIUM CHLORIDE IN NACL 20-0.9 MEQ/L-% IV SOLN
INTRAVENOUS | Status: DC
Start: 2014-02-25 — End: 2014-02-27
  Administered 2014-02-25 – 2014-02-27 (×4): via INTRAVENOUS
  Filled 2014-02-25 (×7): qty 1000

## 2014-02-25 MED ORDER — LISINOPRIL-HYDROCHLOROTHIAZIDE 20-12.5 MG PO TABS: 1.0000 | ORAL_TABLET | Freq: Every day | ORAL | Status: DC

## 2014-02-25 MED ORDER — LISINOPRIL 20 MG PO TABS
20.0000 mg | ORAL_TABLET | Freq: Every day | ORAL | Status: DC
  Administered 2014-02-27 – 2014-02-28 (×2): 20 mg via ORAL
  Filled 2014-02-25 (×3): qty 1

## 2014-02-25 MED ORDER — MORPHINE SULFATE 4 MG/ML IJ SOLN
4.0000 mg | Freq: Once | INTRAMUSCULAR | Status: AC
  Administered 2014-02-25: 4 mg via INTRAVENOUS
  Filled 2014-02-25: qty 1

## 2014-02-25 MED ORDER — NADOLOL 40 MG PO TABS
40.0000 mg | ORAL_TABLET | Freq: Every day | ORAL | Status: DC
  Administered 2014-02-26 – 2014-02-28 (×3): 40 mg via ORAL
  Filled 2014-02-25 (×3): qty 1

## 2014-02-25 MED ORDER — HYDROCHLOROTHIAZIDE 12.5 MG PO CAPS
12.5000 mg | ORAL_CAPSULE | Freq: Every day | ORAL | Status: DC
  Administered 2014-02-27 – 2014-02-28 (×2): 12.5 mg via ORAL
  Filled 2014-02-25 (×3): qty 1

## 2014-02-25 MED ORDER — AMLODIPINE BESYLATE 10 MG PO TABS
10.0000 mg | ORAL_TABLET | Freq: Every day | ORAL | Status: DC
  Administered 2014-02-27 – 2014-02-28 (×2): 10 mg via ORAL
  Filled 2014-02-25 (×3): qty 1

## 2014-02-25 MED ORDER — MONTELUKAST SODIUM 10 MG PO TABS
10.0000 mg | ORAL_TABLET | Freq: Every day | ORAL | Status: DC
  Administered 2014-02-27 – 2014-02-28 (×2): 10 mg via ORAL
  Filled 2014-02-25 (×3): qty 1

## 2014-02-25 MED ORDER — PANTOPRAZOLE SODIUM 40 MG IV SOLR
40.0000 mg | Freq: Every day | INTRAVENOUS | Status: DC
  Administered 2014-02-25 – 2014-02-27 (×3): 40 mg via INTRAVENOUS
  Filled 2014-02-25 (×4): qty 40

## 2014-02-25 MED ORDER — ONDANSETRON HCL 4 MG/2ML IJ SOLN: 4.0000 mg | Freq: Four times a day (QID) | INTRAMUSCULAR | Status: DC | PRN

## 2014-02-25 MED ORDER — MORPHINE SULFATE 4 MG/ML IJ SOLN
4.0000 mg | INTRAMUSCULAR | Status: DC | PRN
  Administered 2014-02-25 – 2014-02-26 (×3): 4 mg via INTRAVENOUS
  Filled 2014-02-25 (×3): qty 1

## 2014-02-25 NOTE — ED Notes (Signed)
Has been seen here for gallstones and given pain medication but the medicine is not working. Gen surgery PA called earlier and states Dr. Marlou Starks will see in the ED.

## 2014-02-25 NOTE — ED Provider Notes (Signed)
Pt met in ED by Dr. Marlou Starks with gen surgery who was aware pt was coming to the ED. He will be taking primary care of pt and will take to the OR for cholecystectomy. Pt was not seen primarily by myself.   Ernestina Patches, MD 02/25/14 [phone]

## 2014-02-25 NOTE — Progress Notes (Signed)
Pt has home cpap and places self on/off.  Rt will continue to monitor.

## 2014-02-25 NOTE — ED Provider Notes (Signed)
Medical screening examination/treatment/procedure(s) were performed by non-physician practitioner and as supervising physician I was immediately available for consultation/collaboration.   EKG Interpretation None       Charlesetta Shanks, MD 02/25/14 [phone]

## 2014-02-25 NOTE — ED Notes (Signed)
Discharge instructions given along with a prescription  Voiced understanding

## 2014-02-25 NOTE — H&P (Signed)
Tommy Parker is an 63 y.o. male.   Chief Complaint: abdominal pain HPI: The pt is a 63yo wm who has been having RUQ pain since last Monday. He denies nausea or vomiting but the pain has been constant and getting worse. He came to ER where u/s shows stones in gallbladder but no gallbladder wall thickening or duct dilation.  Past Medical History  Diagnosis Date  . Hypertension   . Skin cancer   . Acid reflux   . Sleep apnea   . Diabetes mellitus without complication   . Asthma     History reviewed. No pertinent past surgical history.  No family history on file. Social History:  reports that he has been smoking Cigars.  His smokeless tobacco use includes Chew. He reports that he does not drink alcohol or use illicit drugs.  Allergies: No Known Allergies   (Not in a hospital admission)  Results for orders placed during the hospital encounter of 02/24/14 (from the past 48 hour(s))  CBC WITH DIFFERENTIAL     Status: Abnormal   Collection Time    02/24/14  9:03 PM      Result Value Ref Range   WBC 11.0 (*) 4.0 - 10.5 K/uL   RBC 4.83  4.22 - 5.81 MIL/uL   Hemoglobin 15.1  13.0 - 17.0 g/dL   HCT 44.4  39.0 - 52.0 %   MCV 91.9  78.0 - 100.0 fL   MCH 31.3  26.0 - 34.0 pg   MCHC 34.0  30.0 - 36.0 g/dL   RDW 12.2  11.5 - 15.5 %   Platelets 246  150 - 400 K/uL   Neutrophils Relative % 50  43 - 77 %   Neutro Abs 5.5  1.7 - 7.7 K/uL   Lymphocytes Relative 38  12 - 46 %   Lymphs Abs 4.2 (*) 0.7 - 4.0 K/uL   Monocytes Relative 9  3 - 12 %   Monocytes Absolute 1.0  0.1 - 1.0 K/uL   Eosinophils Relative 2  0 - 5 %   Eosinophils Absolute 0.2  0.0 - 0.7 K/uL   Basophils Relative 0  0 - 1 %   Basophils Absolute 0.0  0.0 - 0.1 K/uL  COMPREHENSIVE METABOLIC PANEL     Status: Abnormal   Collection Time    02/24/14  9:03 PM      Result Value Ref Range   Sodium 138  137 - 147 mEq/L   Potassium 4.4  3.7 - 5.3 mEq/L   Chloride 100  96 - 112 mEq/L   CO2 24  19 - 32 mEq/L   Glucose, Bld 192 (*)  70 - 99 mg/dL   BUN 15  6 - 23 mg/dL   Creatinine, Ser 1.13  0.50 - 1.35 mg/dL   Calcium 9.3  8.4 - 10.5 mg/dL   Total Protein 8.1  6.0 - 8.3 g/dL   Albumin 3.6  3.5 - 5.2 g/dL   AST 43 (*) 0 - 37 U/L   ALT 74 (*) 0 - 53 U/L   Alkaline Phosphatase 88  39 - 117 U/L   Total Bilirubin 0.3  0.3 - 1.2 mg/dL   GFR calc non Af Amer 68 (*) >90 mL/min   GFR calc Af Amer 79 (*) >90 mL/min   Comment: (NOTE)     The eGFR has been calculated using the CKD EPI equation.     This calculation has not been validated in all clinical situations.  eGFR's persistently <90 mL/min signify possible Chronic Kidney     Disease.   Anion gap 14  5 - 15  LIPASE, BLOOD     Status: None   Collection Time    02/24/14  9:03 PM      Result Value Ref Range   Lipase 55  11 - 59 U/L   US Abdomen Complete  02/24/2014   CLINICAL DATA:  Right upper quadrant pain.  EXAM: ULTRASOUND ABDOMEN COMPLETE  COMPARISON:  CT abdomen and pelvis 02/22/2014  FINDINGS: Gallbladder:  Diffuse echogenic material demonstrated in the gallbladder consistent with sludge and small stones. No gallbladder wall thickening. Murphy's sign is negative.  Common bile duct:  Diameter: 6.5 mm, upper limits of normal.  Liver:  Diffusely increased hepatic echotexture suggesting mild fatty infiltration. No focal lesions.  IVC:  No abnormality visualized.  Pancreas:  Limited visualization.  Visualized portion appears unremarkable.  Spleen:  Size and appearance within normal limits.  Right Kidney:  Length: 11.3 cm. Echogenicity within normal limits. No mass or hydronephrosis visualized.  Left Kidney:  Length: 12.2 cm. Echogenicity within normal limits. No mass or hydronephrosis visualized.  Abdominal aorta:  No aneurysm visualized.  Other findings:  None.  IMPRESSION: Sludge and small stones in the gallbladder. No gallbladder wall thickening. Murphy's sign negative.   Electronically Signed   By: Lucienne Capers M.D.   On: 02/24/2014 23:14    Review of Systems   Constitutional: Negative.   HENT: Negative.   Eyes: Negative.   Respiratory: Negative.   Cardiovascular: Negative.   Gastrointestinal: Positive for abdominal pain. Negative for nausea and vomiting.  Genitourinary: Negative.   Musculoskeletal: Negative.   Skin: Negative.   Neurological: Negative.   Endo/Heme/Allergies: Negative.   Psychiatric/Behavioral: Negative.     Blood pressure 134/83, pulse 112, temperature 98.6 F (37 C), resp. rate 18, weight 209 lb 1 oz (94.83 kg), SpO2 95.00%. Physical Exam  Constitutional: He is oriented to person, place, and time. He appears well-developed and well-nourished.  HENT:  Head: Normocephalic and atraumatic.  Eyes: Conjunctivae and EOM are normal. Pupils are equal, round, and reactive to light.  Neck: Normal range of motion. Neck supple.  Cardiovascular: Normal rate, regular rhythm and normal heart sounds.   Respiratory: Effort normal and breath sounds normal.  GI: Soft. Bowel sounds are normal.  There is tenderness with mild guarding in RUQ. There is also a small reducible umbilical hernia  Musculoskeletal: Normal range of motion.  Neurological: He is alert and oriented to person, place, and time.  Skin: Skin is warm and dry.  Psychiatric: He has a normal mood and affect. His behavior is normal.     Assessment/Plan The pt appears to have symptomatic gallstones. Because of the risk of further painful episodes and possible pancreatitis I think he would benefit from cholecystectomy during this admission. I have discussed with him the risks and benefits of surgery as well as some of the technical aspects and he understands and wishes to proceed  TOTH III,Vaidehi Braddy S 02/25/2014, 6:30 PM

## 2014-02-26 ENCOUNTER — Encounter (HOSPITAL_COMMUNITY): Payer: Federal, State, Local not specified - PPO | Admitting: Anesthesiology

## 2014-02-26 ENCOUNTER — Encounter (HOSPITAL_COMMUNITY): Payer: Self-pay | Admitting: Anesthesiology

## 2014-02-26 ENCOUNTER — Observation Stay (HOSPITAL_COMMUNITY): Payer: Federal, State, Local not specified - PPO | Admitting: Anesthesiology

## 2014-02-26 ENCOUNTER — Encounter (HOSPITAL_COMMUNITY): Admission: EM | Disposition: A | Discharge status: Home / Self Care | Payer: Self-pay | Source: Home / Self Care

## 2014-02-26 HISTORY — PX: CHOLECYSTECTOMY: SHX55

## 2014-02-26 HISTORY — PX: LAPAROSCOPIC CHOLECYSTECTOMY: SUR755

## 2014-02-26 LAB — SURGICAL PCR SCREEN
MRSA, PCR: NEGATIVE
STAPHYLOCOCCUS AUREUS: NEGATIVE

## 2014-02-26 LAB — GLUCOSE, CAPILLARY
GLUCOSE-CAPILLARY: 142 mg/dL — AB (ref 70–99)
GLUCOSE-CAPILLARY: 158 mg/dL — AB (ref 70–99)

## 2014-02-26 SURGERY — LAPAROSCOPIC CHOLECYSTECTOMY WITH INTRAOPERATIVE CHOLANGIOGRAM
Anesthesia: General | Site: Abdomen

## 2014-02-26 MED ORDER — ROCURONIUM BROMIDE 50 MG/5ML IV SOLN
INTRAVENOUS | Status: AC
  Filled 2014-02-26: qty 1

## 2014-02-26 MED ORDER — HYDROMORPHONE HCL 1 MG/ML IJ SOLN
INTRAMUSCULAR | Status: AC
  Filled 2014-02-26: qty 1

## 2014-02-26 MED ORDER — OXYCODONE HCL 5 MG PO TABS
5.0000 mg | ORAL_TABLET | Freq: Once | ORAL | Status: DC | PRN
Start: 2014-02-26 — End: 2014-02-26

## 2014-02-26 MED ORDER — PHENYLEPHRINE HCL 10 MG/ML IJ SOLN
INTRAMUSCULAR | Status: DC | PRN
  Administered 2014-02-26 (×3): 80 ug via INTRAVENOUS

## 2014-02-26 MED ORDER — HEMOSTATIC AGENTS (NO CHARGE) OPTIME
TOPICAL | Status: DC | PRN
  Administered 2014-02-26 (×2): 1 via TOPICAL

## 2014-02-26 MED ORDER — PROMETHAZINE HCL 25 MG/ML IJ SOLN
6.2500 mg | INTRAMUSCULAR | Status: DC | PRN
  Administered 2014-02-26: 6.25 mg via INTRAVENOUS

## 2014-02-26 MED ORDER — FENTANYL CITRATE 0.05 MG/ML IJ SOLN
INTRAMUSCULAR | Status: AC
  Filled 2014-02-26: qty 5

## 2014-02-26 MED ORDER — OXYCODONE HCL 5 MG/5ML PO SOLN: 5.0000 mg | Freq: Once | ORAL | Status: DC | PRN

## 2014-02-26 MED ORDER — BUPIVACAINE-EPINEPHRINE (PF) 0.25% -1:200000 IJ SOLN
INTRAMUSCULAR | Status: AC
  Filled 2014-02-26: qty 30

## 2014-02-26 MED ORDER — FENTANYL CITRATE 0.05 MG/ML IJ SOLN
INTRAMUSCULAR | Status: DC | PRN
  Administered 2014-02-26 (×2): 100 ug via INTRAVENOUS
  Administered 2014-02-26: 50 ug via INTRAVENOUS
  Administered 2014-02-26: 100 ug via INTRAVENOUS
  Administered 2014-02-26 (×3): 50 ug via INTRAVENOUS

## 2014-02-26 MED ORDER — LIDOCAINE HCL (CARDIAC) 20 MG/ML IV SOLN
INTRAVENOUS | Status: DC | PRN
  Administered 2014-02-26: 80 mg via INTRAVENOUS

## 2014-02-26 MED ORDER — ROCURONIUM BROMIDE 100 MG/10ML IV SOLN
INTRAVENOUS | Status: DC | PRN
  Administered 2014-02-26 (×2): 10 mg via INTRAVENOUS
  Administered 2014-02-26: 30 mg via INTRAVENOUS

## 2014-02-26 MED ORDER — PROMETHAZINE HCL 25 MG/ML IJ SOLN
INTRAMUSCULAR | Status: AC
  Filled 2014-02-26: qty 1

## 2014-02-26 MED ORDER — MIDAZOLAM HCL 2 MG/2ML IJ SOLN
INTRAMUSCULAR | Status: AC
  Filled 2014-02-26: qty 2

## 2014-02-26 MED ORDER — 0.9 % SODIUM CHLORIDE (POUR BTL) OPTIME
TOPICAL | Status: DC | PRN
  Administered 2014-02-26: 1000 mL

## 2014-02-26 MED ORDER — LACTATED RINGERS IV SOLN
INTRAVENOUS | Status: DC | PRN
  Administered 2014-02-26 (×3): via INTRAVENOUS

## 2014-02-26 MED ORDER — PROPOFOL 10 MG/ML IV BOLUS
INTRAVENOUS | Status: DC | PRN
  Administered 2014-02-26: 170 mg via INTRAVENOUS

## 2014-02-26 MED ORDER — SCOPOLAMINE 1 MG/3DAYS TD PT72
MEDICATED_PATCH | TRANSDERMAL | Status: AC
  Administered 2014-02-26: 1.5 mg
  Filled 2014-02-26: qty 1

## 2014-02-26 MED ORDER — MORPHINE SULFATE 2 MG/ML IJ SOLN
1.0000 mg | INTRAMUSCULAR | Status: DC | PRN
  Administered 2014-02-26: 2 mg via INTRAVENOUS
  Administered 2014-02-27: 4 mg via INTRAVENOUS
  Filled 2014-02-26: qty 1
  Filled 2014-02-26: qty 2

## 2014-02-26 MED ORDER — BUPIVACAINE-EPINEPHRINE 0.25% -1:200000 IJ SOLN
INTRAMUSCULAR | Status: DC | PRN
  Administered 2014-02-26: 30 mL

## 2014-02-26 MED ORDER — EPHEDRINE SULFATE 50 MG/ML IJ SOLN
INTRAMUSCULAR | Status: DC | PRN
  Administered 2014-02-26 (×2): 10 mg via INTRAVENOUS

## 2014-02-26 MED ORDER — DEXTROSE 5 % IV SOLN
1.0000 g | Freq: Once | INTRAVENOUS | Status: AC
  Administered 2014-02-26: 1 g via INTRAVENOUS
  Filled 2014-02-26: qty 10

## 2014-02-26 MED ORDER — DEXTROSE 5 % IV SOLN
1.0000 g | Freq: Once | INTRAVENOUS | Status: DC
  Filled 2014-02-26: qty 10

## 2014-02-26 MED ORDER — ENOXAPARIN SODIUM 40 MG/0.4ML ~~LOC~~ SOLN
40.0000 mg | SUBCUTANEOUS | Status: DC
  Administered 2014-02-27: 40 mg via SUBCUTANEOUS
  Filled 2014-02-26 (×2): qty 0.4

## 2014-02-26 MED ORDER — OXYCODONE-ACETAMINOPHEN 5-325 MG PO TABS
1.0000 | ORAL_TABLET | ORAL | Status: DC | PRN
  Administered 2014-02-26 – 2014-02-27 (×4): 2 via ORAL
  Filled 2014-02-26 (×4): qty 2

## 2014-02-26 MED ORDER — ONDANSETRON HCL 4 MG/2ML IJ SOLN
INTRAMUSCULAR | Status: DC | PRN
  Administered 2014-02-26: 4 mg via INTRAVENOUS

## 2014-02-26 MED ORDER — ACETAMINOPHEN 325 MG PO TABS
650.0000 mg | ORAL_TABLET | ORAL | Status: DC | PRN
  Administered 2014-02-26: 650 mg via ORAL
  Filled 2014-02-26: qty 2

## 2014-02-26 MED ORDER — SCOPOLAMINE 1 MG/3DAYS TD PT72: 1.0000 | MEDICATED_PATCH | TRANSDERMAL | Status: DC

## 2014-02-26 MED ORDER — EPHEDRINE SULFATE 50 MG/ML IJ SOLN
INTRAMUSCULAR | Status: AC
  Filled 2014-02-26: qty 1

## 2014-02-26 MED ORDER — SODIUM CHLORIDE 0.9 % IR SOLN
Status: DC | PRN
  Administered 2014-02-26 (×2): 1000 mL

## 2014-02-26 MED ORDER — LIDOCAINE HCL (CARDIAC) 20 MG/ML IV SOLN
INTRAVENOUS | Status: AC
  Filled 2014-02-26: qty 5

## 2014-02-26 MED ORDER — SODIUM CHLORIDE 0.9 % IJ SOLN
INTRAMUSCULAR | Status: AC
  Filled 2014-02-26: qty 10

## 2014-02-26 MED ORDER — ONDANSETRON HCL 4 MG/2ML IJ SOLN
INTRAMUSCULAR | Status: AC
  Filled 2014-02-26: qty 2

## 2014-02-26 MED ORDER — PROPOFOL 10 MG/ML IV BOLUS
INTRAVENOUS | Status: AC
  Filled 2014-02-26: qty 20

## 2014-02-26 MED ORDER — GLYCOPYRROLATE 0.2 MG/ML IJ SOLN
INTRAMUSCULAR | Status: AC
  Filled 2014-02-26: qty 2

## 2014-02-26 MED ORDER — HYDROMORPHONE HCL 1 MG/ML IJ SOLN
0.2500 mg | INTRAMUSCULAR | Status: DC | PRN
  Administered 2014-02-26 (×7): 0.5 mg via INTRAVENOUS

## 2014-02-26 MED ORDER — MIDAZOLAM HCL 5 MG/5ML IJ SOLN
INTRAMUSCULAR | Status: DC | PRN
  Administered 2014-02-26 (×2): 1 mg via INTRAVENOUS

## 2014-02-26 MED ORDER — NEOSTIGMINE METHYLSULFATE 10 MG/10ML IV SOLN
INTRAVENOUS | Status: DC | PRN
  Administered 2014-02-26: 3 mg via INTRAVENOUS

## 2014-02-26 MED ORDER — GLYCOPYRROLATE 0.2 MG/ML IJ SOLN
INTRAMUSCULAR | Status: DC | PRN
  Administered 2014-02-26: 0.4 mg via INTRAVENOUS

## 2014-02-26 MED ORDER — LACTATED RINGERS IV SOLN
INTRAVENOUS | Status: DC
  Administered 2014-02-26: 09:00:00 via INTRAVENOUS

## 2014-02-26 MED ORDER — NEOSTIGMINE METHYLSULFATE 10 MG/10ML IV SOLN
INTRAVENOUS | Status: AC
  Filled 2014-02-26: qty 1

## 2014-02-26 MED ORDER — SODIUM CHLORIDE 0.9 % IV SOLN
INTRAVENOUS | Status: DC | PRN
  Administered 2014-02-26: 10:00:00

## 2014-02-26 SURGICAL SUPPLY — 54 items
APPLIER CLIP 5 13 M/L LIGAMAX5 (MISCELLANEOUS) ×3
APPLIER CLIP ROT 10 11.4 M/L (STAPLE) ×3
BLADE SURG ROTATE 9660 (MISCELLANEOUS) IMPLANT
CANISTER SUCTION 2500CC (MISCELLANEOUS) ×3 IMPLANT
CHLORAPREP W/TINT 26ML (MISCELLANEOUS) ×3 IMPLANT
CLIP APPLIE 5 13 M/L LIGAMAX5 (MISCELLANEOUS) ×1 IMPLANT
CLIP APPLIE ROT 10 11.4 M/L (STAPLE) ×1 IMPLANT
COVER MAYO STAND STRL (DRAPES) ×3 IMPLANT
COVER SURGICAL LIGHT HANDLE (MISCELLANEOUS) ×3 IMPLANT
DERMABOND ADVANCED (GAUZE/BANDAGES/DRESSINGS) ×2
DERMABOND ADVANCED .7 DNX12 (GAUZE/BANDAGES/DRESSINGS) ×1 IMPLANT
DRAPE C-ARM 42X72 X-RAY (DRAPES) ×3 IMPLANT
DRAPE UTILITY 15X26 W/TAPE STR (DRAPE) ×6 IMPLANT
ELECT REM PT RETURN 9FT ADLT (ELECTROSURGICAL) ×3
ELECTRODE REM PT RTRN 9FT ADLT (ELECTROSURGICAL) ×1 IMPLANT
FILTER SMOKE EVAC LAPAROSHD (FILTER) IMPLANT
GLOVE BIO SURGEON STRL SZ 6.5 (GLOVE) ×4 IMPLANT
GLOVE BIO SURGEON STRL SZ7.5 (GLOVE) ×3 IMPLANT
GLOVE BIO SURGEON STRL SZ8 (GLOVE) ×3 IMPLANT
GLOVE BIO SURGEONS STRL SZ 6.5 (GLOVE) ×2
GLOVE BIOGEL PI IND STRL 6.5 (GLOVE) ×1 IMPLANT
GLOVE BIOGEL PI IND STRL 7.0 (GLOVE) ×1 IMPLANT
GLOVE BIOGEL PI IND STRL 8 (GLOVE) ×2 IMPLANT
GLOVE BIOGEL PI INDICATOR 6.5 (GLOVE) ×2
GLOVE BIOGEL PI INDICATOR 7.0 (GLOVE) ×2
GLOVE BIOGEL PI INDICATOR 8 (GLOVE) ×4
GLOVE ECLIPSE 6.5 STRL STRAW (GLOVE) ×3 IMPLANT
GLOVE ECLIPSE 7.5 STRL STRAW (GLOVE) ×3 IMPLANT
GOWN STRL REUS W/ TWL LRG LVL3 (GOWN DISPOSABLE) ×5 IMPLANT
GOWN STRL REUS W/ TWL XL LVL3 (GOWN DISPOSABLE) ×2 IMPLANT
GOWN STRL REUS W/TWL LRG LVL3 (GOWN DISPOSABLE) ×10
GOWN STRL REUS W/TWL XL LVL3 (GOWN DISPOSABLE) ×4
HEMOSTAT SNOW SURGICEL 2X4 (HEMOSTASIS) ×6 IMPLANT
KIT BASIN OR (CUSTOM PROCEDURE TRAY) ×3 IMPLANT
KIT ROOM TURNOVER OR (KITS) ×3 IMPLANT
NEEDLE 22X1 1/2 (OR ONLY) (NEEDLE) ×3 IMPLANT
NS IRRIG 1000ML POUR BTL (IV SOLUTION) ×3 IMPLANT
PAD ARMBOARD 7.5X6 YLW CONV (MISCELLANEOUS) ×3 IMPLANT
POUCH RETRIEVAL ECOSAC 10 (ENDOMECHANICALS) ×2 IMPLANT
POUCH RETRIEVAL ECOSAC 10MM (ENDOMECHANICALS) ×4
POUCH SPECIMEN RETRIEVAL 10MM (ENDOMECHANICALS) ×3 IMPLANT
SCISSORS LAP 5X35 DISP (ENDOMECHANICALS) ×3 IMPLANT
SET CHOLANGIOGRAPH 5 50 .035 (SET/KITS/TRAYS/PACK) ×3 IMPLANT
SET IRRIG TUBING LAPAROSCOPIC (IRRIGATION / IRRIGATOR) ×3 IMPLANT
SLEEVE ENDOPATH XCEL 5M (ENDOMECHANICALS) ×6 IMPLANT
SPECIMEN JAR SMALL (MISCELLANEOUS) ×3 IMPLANT
SUT VIC AB 4-0 PS2 27 (SUTURE) ×3 IMPLANT
TOWEL OR 17X24 6PK STRL BLUE (TOWEL DISPOSABLE) ×3 IMPLANT
TOWEL OR 17X26 10 PK STRL BLUE (TOWEL DISPOSABLE) ×3 IMPLANT
TRAY LAPAROSCOPIC (CUSTOM PROCEDURE TRAY) ×3 IMPLANT
TROCAR BLADELESS 11MM (ENDOMECHANICALS) ×3 IMPLANT
TROCAR XCEL BLUNT TIP 100MML (ENDOMECHANICALS) ×3 IMPLANT
TROCAR XCEL NON-BLD 5MMX100MML (ENDOMECHANICALS) ×3 IMPLANT
TUBING INSUFFLATION (TUBING) ×3 IMPLANT

## 2014-02-26 NOTE — Progress Notes (Signed)
UR completed 

## 2014-02-26 NOTE — Progress Notes (Signed)
Patient on home CPAP added 3L o2 and doing well.

## 2014-02-26 NOTE — Anesthesia Preprocedure Evaluation (Signed)
Anesthesia Evaluation    History of Anesthesia Complications Negative for: history of anesthetic complications  Airway       Dental   Pulmonary sleep apnea , Current Smoker,          Cardiovascular hypertension, Pt. on home beta blockers     Neuro/Psych negative neurological ROS  negative psych ROS   GI/Hepatic Neg liver ROS, GERD-  ,  Endo/Other  diabetes  Renal/GU negative Renal ROS     Musculoskeletal   Abdominal   Peds  Hematology   Anesthesia Other Findings   Reproductive/Obstetrics                           Anesthesia Physical Anesthesia Plan  ASA: II  Anesthesia Plan: General   Post-op Pain Management:    Induction: Intravenous  Airway Management Planned: Oral ETT  Additional Equipment:   Intra-op Plan:   Post-operative Plan: Extubation in OR  Informed Consent:   Plan Discussed with:   Anesthesia Plan Comments:         Anesthesia Quick Evaluation

## 2014-02-26 NOTE — Progress Notes (Signed)
Patient transported to OR. Report called and given to [facility], Therapist, sports. VS stable.

## 2014-02-26 NOTE — Progress Notes (Signed)
  Subjective: RUQ pain a bit better  Objective: Vital signs in last 24 hours: Temp:  [98.1 F (36.7 C)-98.6 F (37 C)] 98.1 F (36.7 C) (09/29 0604) Pulse Rate:  [87-112] 92 (09/29 0604) Resp:  [16-19] 19 (09/29 0604) BP: (134-151)/(82-88) 147/88 mmHg (09/29 0604) SpO2:  [92 %-96 %] 96 % (09/29 0604) Weight:  [209 lb 1 oz (94.83 kg)] 209 lb 1 oz (94.83 kg) (09/28 1738) Last BM Date: 02/23/14  Intake/Output from previous day: 09/28 0701 - 09/29 0700 In: 1011 [I.V.:1011] Out: 200 [Urine:200] Intake/Output this shift:    General appearance: alert and cooperative Head: nasal CPAP Resp: clear to auscultation bilaterally Cardio: regularly irregular rhythm GI: soft, tender RUQ without guarding Correction cardio: RRR  Lab Results:   Recent Labs  02/24/14 2103 02/25/14 1835  WBC 11.0* 8.8  HGB 15.1 14.6  HCT 44.4 42.7  PLT 246 212   BMET  Recent Labs  02/24/14 2103 02/25/14 1835  NA 138 137  K 4.4 4.1  CL 100 100  CO2 24 24  GLUCOSE 192* 224*  BUN 15 13  CREATININE 1.13 0.85  CALCIUM 9.3 9.1   PT/INR No results found for this basename: LABPROT, INR,  in the last 72 hours ABG No results found for this basename: PHART, PCO2, PO2, HCO3,  in the last 72 hours  Studies/Results: US Abdomen Complete  02/24/2014   CLINICAL DATA:  Right upper quadrant pain.  EXAM: ULTRASOUND ABDOMEN COMPLETE  COMPARISON:  CT abdomen and pelvis 02/22/2014  FINDINGS: Gallbladder:  Diffuse echogenic material demonstrated in the gallbladder consistent with sludge and small stones. No gallbladder wall thickening. Murphy's sign is negative.  Common bile duct:  Diameter: 6.5 mm, upper limits of normal.  Liver:  Diffusely increased hepatic echotexture suggesting mild fatty infiltration. No focal lesions.  IVC:  No abnormality visualized.  Pancreas:  Limited visualization.  Visualized portion appears unremarkable.  Spleen:  Size and appearance within normal limits.  Right Kidney:  Length: 11.3  cm. Echogenicity within normal limits. No mass or hydronephrosis visualized.  Left Kidney:  Length: 12.2 cm. Echogenicity within normal limits. No mass or hydronephrosis visualized.  Abdominal aorta:  No aneurysm visualized.  Other findings:  None.  IMPRESSION: Sludge and small stones in the gallbladder. No gallbladder wall thickening. Murphy's sign negative.   Electronically Signed   By: Lucienne Capers M.D.   On: 02/24/2014 23:14    Anti-infectives: Anti-infectives   None      Assessment/Plan: Symptomatic cholelithiasis, likely cholecystitis - to OR for laparoscopic cholecystectomy. Procedure, risks, and benefits D/W him and he agrees. ID - rocephin  LOS: 1 day    Hughie Melroy E 02/26/2014

## 2014-02-26 NOTE — Transfer of Care (Signed)
Immediate Anesthesia Transfer of Care Note  Patient: Tommy Parker  Procedure(s) Performed: Procedure(s): LAPAROSCOPIC CHOLECYSTECTOMY WITH INTRAOPERATIVE CHOLANGIOGRAM (N/A)  Patient Location: PACU  Anesthesia Type:General  Level of Consciousness: awake, alert , oriented and patient cooperative  Airway & Oxygen Therapy: Patient Spontanous Breathing and Patient connected to face mask oxygen  Post-op Assessment: Report given to PACU RN, Post -op Vital signs reviewed and stable and Patient moving all extremities  Post vital signs: Reviewed and stable  Complications: No apparent anesthesia complications

## 2014-02-26 NOTE — Anesthesia Postprocedure Evaluation (Signed)
  Anesthesia Post-op Note  Patient: Tommy Parker  Procedure(s) Performed: Procedure(s): LAPAROSCOPIC CHOLECYSTECTOMY WITH INTRAOPERATIVE CHOLANGIOGRAM (N/A)  Patient Location: PACU  Anesthesia Type:General  Level of Consciousness: awake and alert   Airway and Oxygen Therapy: Patient Spontanous Breathing  Post-op Pain: moderate  Post-op Assessment: Post-op Vital signs reviewed, Patient's Cardiovascular Status Stable, Respiratory Function Stable, Patent Airway, No signs of Nausea or vomiting and Pain level controlled  Post-op Vital Signs: Reviewed and stable  Last Vitals:  Filed Vitals:   02/26/14 1508  BP: 133/104  Pulse: 93  Temp: 36.9 C  Resp: 16    Complications: No apparent anesthesia complications

## 2014-02-26 NOTE — Op Note (Signed)
02/25/2014 - 02/26/2014  1:09 PM  PATIENT:  Tommy Parker  62 y.o. male  PRE-OPERATIVE DIAGNOSIS:  Symptomatic cholelithiasis  POST-OPERATIVE DIAGNOSIS:  Acute Cholecystitis  PROCEDURE:  Procedure(s): LAPAROSCOPIC CHOLECYSTECTOMY  SURGEON:  Georganna Skeans, M.D.  ASSISTANTS: Judeth Horn, MD   ANESTHESIA:   local and general  EBL:  Total I/O In: 2000 [I.V.:2000] Out: -   BLOOD ADMINISTERED:none  DRAINS: none   SPECIMEN:  Excision  DISPOSITION OF SPECIMEN:  PATHOLOGY  COUNTS:  YES  DICTATION: Viviann Spare Dictation Patient is brought for cholecystectomy. Informed consent was obtained. He received intravenous antibiotics. He was identified in preop holding area. He was brought to the operating room and general endotracheal anesthesia was administered by the anesthesia staff. Abdomen was prepped and draped in sterile fashion. Time out procedure was performed. Supra-and local region was infiltrated with local. Supra umbilical  incision was made. Subcutaneous tissues were dissected down revealing the anterior fascia. This was divided sharply along the midline. Perineal cavity was entered under direct vision. 0 Vicryl pursestring was placed on the fascial opening. Hassan trocar was inserted. Abdomen was insufflated with carbon dioxide in standard fashion. Under direct vision, a 5 mm epigastric and millimeters lateral ports x2 were placed. Local was used at each port site. Arthroscopic exploration revealed a very tense and distended gallbladder. Upon grasping it ruptured and bile was suctioned out. There were dense omental adhesions to the body. These were gradually swept down in a meticulous fashion. It was very acutely inflamed. Tedious dissection continued, finally the infundibulum was revealed. We continued sweeping down the omentum. The gallbladder was freed up from the liver medially and laterally to facilitate visualization. We gained excellent visualization around the infundibulum and the  infundibular cystic duct junction area. It seemed too perilous to go lower toward the cystic common duct junction. At this time was further dissected the gallbladder off the liver bed in this area. We upsized the epigastric port 11 mm to facilitate other instrumentation.We found anterior & posterior branch of the cystic artery. These were both clipped twice proximally and divided. We revisited the cystic duct decision was made not to do a cholangiogram as it was quite short. Severe inflammation precluded further dissection there. 3 clips were placed on the infundibular cystic duct junction. Infundibulum was divided. Gallbladder was taken the rest away off the liver bed using Bovie cautery achieving good hemostasis. The gallbladder was placed in Endo Catch bag and removed from the abdomen via the supraumbilical port site. Liver bed was cauterized to get excellent hemostasis. Abdomen was copiously irrigated.Clips remain in good position. Liver bed was dry.Irrigation fluid returned clear. 2 pieces of Surgicel snow were placed in the bed of the liver and there was good hemostasis with that. Residual irrigation was evacuated. Ports were removed under direct vision. Pneumoperitoneum was released. Supraumbilical fascia was closed by tying the pursestring. All wounds were copiously irrigated the skin of each was closed with running 4 Vicryl subcuticular stitch followed by Dermabond. All counts were correct. Patient tolerated procedure well and was taken recovery in stable condition.  PATIENT DISPOSITION:  PACU - hemodynamically stable.   Delay start of Pharmacological VTE agent (>24hrs) due to surgical blood loss or risk of bleeding:  no  Georganna Skeans, MD, MPH, FACS Pager: [phone]  9/29/20151:09 PM

## 2014-02-26 NOTE — Progress Notes (Signed)
Patient present back from PACU. Report received from Marquis Buggy, RN. Wife at bedside.

## 2014-02-26 NOTE — Progress Notes (Signed)
May continue dilaudid in pacu - per Dr Ermalene Postin

## 2014-02-27 ENCOUNTER — Encounter (HOSPITAL_COMMUNITY): Payer: Self-pay | Admitting: General Surgery

## 2014-02-27 DIAGNOSIS — I10 Essential (primary) hypertension: Secondary | ICD-10-CM | POA: Diagnosis present

## 2014-02-27 DIAGNOSIS — K8020 Calculus of gallbladder without cholecystitis without obstruction: Secondary | ICD-10-CM | POA: Diagnosis present

## 2014-02-27 DIAGNOSIS — F17290 Nicotine dependence, other tobacco product, uncomplicated: Secondary | ICD-10-CM | POA: Diagnosis present

## 2014-02-27 DIAGNOSIS — K219 Gastro-esophageal reflux disease without esophagitis: Secondary | ICD-10-CM | POA: Diagnosis present

## 2014-02-27 DIAGNOSIS — J45909 Unspecified asthma, uncomplicated: Secondary | ICD-10-CM | POA: Diagnosis present

## 2014-02-27 DIAGNOSIS — E119 Type 2 diabetes mellitus without complications: Secondary | ICD-10-CM | POA: Diagnosis present

## 2014-02-27 DIAGNOSIS — K8000 Calculus of gallbladder with acute cholecystitis without obstruction: Secondary | ICD-10-CM | POA: Diagnosis present

## 2014-02-27 DIAGNOSIS — Z85828 Personal history of other malignant neoplasm of skin: Secondary | ICD-10-CM | POA: Diagnosis not present

## 2014-02-27 DIAGNOSIS — F17220 Nicotine dependence, chewing tobacco, uncomplicated: Secondary | ICD-10-CM | POA: Diagnosis present

## 2014-02-27 MED ORDER — DEXTROSE 5 % IV SOLN
1.0000 g | INTRAVENOUS | Status: DC
  Administered 2014-02-27 – 2014-02-28 (×2): 1 g via INTRAVENOUS
  Filled 2014-02-27 (×2): qty 10

## 2014-02-27 MED ORDER — POLYETHYLENE GLYCOL 3350 17 G PO PACK
17.0000 g | PACK | Freq: Every day | ORAL | Status: DC
  Administered 2014-02-27: 17 g via ORAL
  Filled 2014-02-27 (×2): qty 1

## 2014-02-27 MED ORDER — DOCUSATE SODIUM 100 MG PO CAPS
100.0000 mg | ORAL_CAPSULE | Freq: Two times a day (BID) | ORAL | Status: DC
  Administered 2014-02-27 (×2): 100 mg via ORAL
  Filled 2014-02-27 (×3): qty 1

## 2014-02-27 NOTE — Progress Notes (Signed)
Patient ID: Tommy Parker, male   DOB: [birth year], 63 y.o.   MRN: [id number] 1 Day Post-Op  Subjective: Pt feels well and wants to go home.  Tolerating his clear liquids and wants more to eat.  Pain well controlled and much better.  Objective: Vital signs in last 24 hours: Temp:  [97 F (36.1 C)-102.1 F (38.9 C)] 98.9 F (37.2 C) (09/30 0932) Pulse Rate:  [83-100] 92 (09/30 0932) Resp:  [12-21] 16 (09/30 0932) BP: (110-156)/(68-104) 142/75 mmHg (09/30 0932) SpO2:  [87 %-98 %] 97 % (09/30 0932) Weight:  [208 lb (94.348 kg)] 208 lb (94.348 kg) (09/29 1727) Last BM Date: 02/23/14  Intake/Output from previous day: 09/29 0701 - 09/30 0700 In: 4871 [P.O.:1060; I.V.:3811] Out: 1300 [Urine:1300] Intake/Output this shift: Total I/O In: 360 [P.O.:360] Out: 250 [Urine:250]  PE: Abd: soft, appropriately tender, +BS, ND, incisions c/d/i  Lab Results:   Recent Labs  02/24/14 2103 02/25/14 1835  WBC 11.0* 8.8  HGB 15.1 14.6  HCT 44.4 42.7  PLT 246 212   BMET  Recent Labs  02/24/14 2103 02/25/14 1835  NA 138 137  K 4.4 4.1  CL 100 100  CO2 24 24  GLUCOSE 192* 224*  BUN 15 13  CREATININE 1.13 0.85  CALCIUM 9.3 9.1   PT/INR No results found for this basename: LABPROT, INR,  in the last 72 hours CMP     Component Value Date/Time   NA 137 02/25/2014 1835   K 4.1 02/25/2014 1835   CL 100 02/25/2014 1835   CO2 24 02/25/2014 1835   GLUCOSE 224* 02/25/2014 1835   BUN 13 02/25/2014 1835   CREATININE 0.85 02/25/2014 1835   CALCIUM 9.1 02/25/2014 1835   PROT 7.8 02/25/2014 1835   ALBUMIN 3.3* 02/25/2014 1835   AST 46* 02/25/2014 1835   ALT 72* 02/25/2014 1835   ALKPHOS 105 02/25/2014 1835   BILITOT 0.4 02/25/2014 1835   GFRNONAA >90 02/25/2014 1835   GFRAA >90 02/25/2014 1835   Lipase     Component Value Date/Time   LIPASE 44 02/25/2014 1835       Studies/Results: No results found.  Anti-infectives: Anti-infectives   Start     Dose/Rate Route Frequency Ordered Stop   02/27/14 1030  cefTRIAXone (ROCEPHIN) 1 g in dextrose 5 % 50 mL IVPB  Status:  Discontinued    Comments:  cholecystitis   1 g 100 mL/hr over 30 Minutes Intravenous  Once 02/26/14 1454 02/27/14 0927   02/27/14 1015  cefTRIAXone (ROCEPHIN) 1 g in dextrose 5 % 50 mL IVPB    Comments:  cholecystitis   1 g 100 mL/hr over 30 Minutes Intravenous Every 24 hours 02/27/14 0927     02/26/14 0800  [MAR Hold]  cefTRIAXone (ROCEPHIN) 1 g in dextrose 5 % 50 mL IVPB     (On MAR Hold since 02/26/14 0855)  Comments:  cholecystitis   1 g 100 mL/hr over 30 Minutes Intravenous  Once 02/26/14 0755 02/26/14 1100       Assessment/Plan  1. POD 1, s/p lap chole for acute cholecystitis 2. Fever  Plan: 1. Patient had acute cholecystitis and spiked a fever over night of 102.1.  Given how acute his gallbladder was and fevers, we will keep him today for an extra day of IV abx therapy.  If he remains AF today and tonight and is still doing well, then will plan for dc home tomorrow.  Advance to regular diet.  Patient and spouse agreeable to  plan.   LOS: 2 days    Ad Guttman E 02/27/2014, 10:22 AM Pager: [phone]

## 2014-02-27 NOTE — Progress Notes (Signed)
Patient wears home CPAP independently.

## 2014-02-27 NOTE — Progress Notes (Signed)
Passing some gas now. IV abx until tomorrow then hopefully home on PO. Patient examined and I agree with the assessment and plan  Georganna Skeans, MD, MPH, FACS Trauma: [phone] General Surgery: [phone]  02/27/2014 12:58 PM

## 2014-02-28 LAB — CBC
HCT: 39.2 % (ref 39.0–52.0)
Hemoglobin: 13.1 g/dL (ref 13.0–17.0)
MCH: 30.8 pg (ref 26.0–34.0)
MCHC: 33.4 g/dL (ref 30.0–36.0)
MCV: 92 fL (ref 78.0–100.0)
Platelets: 197 10*3/uL (ref 150–400)
RBC: 4.26 MIL/uL (ref 4.22–5.81)
RDW: 12.2 % (ref 11.5–15.5)
WBC: 9.5 10*3/uL (ref 4.0–10.5)

## 2014-02-28 LAB — COMPREHENSIVE METABOLIC PANEL
ALT: 68 U/L — ABNORMAL HIGH (ref 0–53)
AST: 49 U/L — ABNORMAL HIGH (ref 0–37)
Albumin: 2.9 g/dL — ABNORMAL LOW (ref 3.5–5.2)
Alkaline Phosphatase: 110 U/L (ref 39–117)
Anion gap: 9 (ref 5–15)
BILIRUBIN TOTAL: 0.6 mg/dL (ref 0.3–1.2)
BUN: 10 mg/dL (ref 6–23)
CALCIUM: 8.9 mg/dL (ref 8.4–10.5)
CO2: 29 meq/L (ref 19–32)
CREATININE: 0.85 mg/dL (ref 0.50–1.35)
Chloride: 104 mEq/L (ref 96–112)
GFR calc Af Amer: >90 mL/min (ref >90)
GFR calc non Af Amer: >90 mL/min (ref >90)
Glucose, Bld: 199 mg/dL — ABNORMAL HIGH (ref 70–99)
Potassium: 4.2 mEq/L (ref 3.7–5.3)
Sodium: 142 mEq/L (ref 137–147)
Total Protein: 6.9 g/dL (ref 6.0–8.3)

## 2014-02-28 MED ORDER — AMOXICILLIN-POT CLAVULANATE 875-125 MG PO TABS: 1.0000 | ORAL_TABLET | Freq: Two times a day (BID) | ORAL | Status: AC

## 2014-02-28 MED ORDER — OXYCODONE-ACETAMINOPHEN 5-325 MG PO TABS: 1.0000 | ORAL_TABLET | ORAL | Status: AC | PRN

## 2014-02-28 NOTE — Discharge Summary (Signed)
Kazuto Sevey, MD, MPH, FACS Trauma: [phone] General Surgery: [phone]  

## 2014-02-28 NOTE — Discharge Summary (Signed)
Patient ID: Tommy Parker MRN: [id number] DOB/AGE: [birth year] 63 y.o.  Admit date: 02/25/2014 Discharge date: 02/28/2014  Procedures: laparoscopic cholecystectomy by Dr. Georganna Skeans on 02-26-14  Consults: None  Reason for Admission: The pt is a 63yo wm who has been having RUQ pain since last Monday. He denies nausea or vomiting but the pain has been constant and getting worse. He came to ER where u/s shows stones in gallbladder but no gallbladder wall thickening or duct dilation.  Admission Diagnoses:  1. Acute cholecystitis 2. HTN 3. DM  Hospital Course: The patient was admitted and started on IV abx therapy.  He was taken to the OR where he underwent a lap chole.  He had acute cholecystitis with a lot of inflammation.  An IOC was unable to be performed.  On POD 1, he was doing well, but had a fever of 102.1.  His Rocephin was continued and he was kept for an additional day of IV abx therapy.  On POD 2, he was afebrile and tolerating a solid diet with minimal pain.  He was stable for dc home.  PE: Abd: soft, minimally tender, incisions c/d/i with dermabond, ND  Discharge Diagnoses:  Active Problems:   Gallstones acute cholecystitis, s/p lap chole  Discharge Medications:   Medication List         amLODipine 10 MG tablet  Commonly known as:  NORVASC  Take 10 mg by mouth daily.     amoxicillin-clavulanate 875-125 MG per tablet  Commonly known as:  AUGMENTIN  Take 1 tablet by mouth 2 (two) times daily.  Start taking on:  03/01/2014     FARXIGA PO  Take 1 tablet by mouth daily. Unknown strength. Patient using sample pack.     ibuprofen 200 MG tablet  Commonly known as:  ADVIL,MOTRIN  Take 600 mg by mouth every 8 (eight) hours as needed for moderate pain.     lisinopril-hydrochlorothiazide 20-12.5 MG per tablet  Commonly known as:  PRINZIDE,ZESTORETIC  Take 1 tablet by mouth daily.     montelukast 10 MG tablet  Commonly known as:  SINGULAIR  Take 10 mg by mouth daily.      nadolol 40 MG tablet  Commonly known as:  CORGARD  Take 40 mg by mouth daily.     oxyCODONE-acetaminophen 5-325 MG per tablet  Commonly known as:  PERCOCET/ROXICET  Take 1-2 tablets by mouth every 4 (four) hours as needed for severe pain.     pantoprazole 40 MG tablet  Commonly known as:  PROTONIX  Take 40 mg by mouth daily.     sitaGLIPtin-metformin 50-1000 MG per tablet  Commonly known as:  JANUMET  Take 1 tablet by mouth 2 (two) times daily with a meal.     traMADol 50 MG tablet  Commonly known as:  ULTRAM  Take 50 mg by mouth every 6 (six) hours as needed for moderate pain.        Discharge Instructions:     Follow-up Information   Follow up with [facility] On 03/19/2014. (2:45pm, arrive no later than 2:15pm for paperwork)    Contact information:   [place] [place]   [place] [address] [phone]       Signed: Henreitta Cea 02/28/2014, 10:01 AM

## 2014-02-28 NOTE — Progress Notes (Signed)
Tommy Parker to be D/C'd Home per MD order.  Discussed with the patient and all questions fully answered.  VSS, Skin clean, dry and intact without evidence of skin break down, no evidence of skin tears noted. Surgical sites clean, dry, intact with no sign of infection.   IV catheter discontinued intact. Site without signs and symptoms of complications. Dressing and pressure applied.  An After Visit Summary was printed and given to the patient. Patient received prescriptions x2.  D/c education completed with patient/family including follow up instructions, medication list, d/c activities limitations if indicated, with other d/c instructions as indicated by MD - patient able to verbalize understanding, all questions fully answered.   Patient instructed to return to ED, call 911, or call MD for any changes in condition.   Patient escorted via [place], and D/C home via private auto.  [name] D 02/28/2014 11:12 AM

## 2014-02-28 NOTE — Discharge Instructions (Signed)
[facility] ______[facility] °LAPAROSCOPIC SURGERY: POST OP INSTRUCTIONS °Always review your discharge instruction sheet given to you by the facility where your surgery was performed. °IF YOU HAVE DISABILITY OR FAMILY LEAVE FORMS, YOU MUST BRING THEM TO THE OFFICE FOR PROCESSING.   °DO NOT GIVE THEM TO YOUR DOCTOR. ° °1. A prescription for pain medication may be given to you upon discharge.  Take your pain medication as prescribed, if needed.  If narcotic pain medicine is not needed, then you may take acetaminophen (Tylenol) or ibuprofen (Advil) as needed. °2. Take your usually prescribed medications unless otherwise directed. °3. If you need a refill on your pain medication, please contact your pharmacy.  They will contact our office to request authorization. Prescriptions will not be filled after 5pm or on week-ends. °4. You should follow a light diet the first few days after arrival home, such as soup and crackers, etc.  Be sure to include lots of fluids daily. °5. Most patients will experience some swelling and bruising in the area of the incisions.  Ice packs will help.  Swelling and bruising can take several days to resolve.  °6. It is common to experience some constipation if taking pain medication after surgery.  Increasing fluid intake and taking a stool softener (such as Colace) will usually help or prevent this problem from occurring.  A mild laxative (Milk of Magnesia or Miralax) should be taken according to package instructions if there are no bowel movements after 48 hours. °7. Unless discharge instructions indicate otherwise, you may remove your bandages 24-48 hours after surgery, and you may shower at that time.  You may have steri-strips (small skin tapes) in place directly over the incision.  These strips should be left on the skin for 7-10 days.  If your surgeon used skin glue on the incision, you may shower in 24 hours.  The glue will flake off over the next 2-3 weeks.  Any sutures or  staples will be removed at the office during your follow-up visit. °8. ACTIVITIES:  You may resume regular (light) daily activities beginning the next day--such as daily self-care, walking, climbing stairs--gradually increasing activities as tolerated.  You may have sexual intercourse when it is comfortable.  Refrain from any heavy lifting or straining until approved by your doctor. °a. You may drive when you are no longer taking prescription pain medication, you can comfortably wear a seatbelt, and you can safely maneuver your car and apply brakes. °b. RETURN TO WORK:  __________________________________________________________ °9. You should see your doctor in the office for a follow-up appointment approximately 2-3 weeks after your surgery.  Make sure that you call for this appointment within a day or two after you arrive home to insure a convenient appointment time. °10. OTHER INSTRUCTIONS: __________________________________________________________________________________________________________________________ __________________________________________________________________________________________________________________________ °WHEN TO CALL YOUR DOCTOR: °1. Fever over 101.0 °2. Inability to urinate °3. Continued bleeding from incision. °4. Increased pain, redness, or drainage from the incision. °5. Increasing abdominal pain ° °The clinic staff is available to answer your questions during regular business hours.  Please don’t hesitate to call and ask to speak to one of the nurses for clinical concerns.  If you have a medical emergency, go to the nearest emergency room or call 911.  A surgeon from [facility] is always on call at the hospital. °[address], [address], [place], [place]  [address] ? [address] [address], [place], [place]   [address] °[phone] ? [phone] ? FAX [phone] °Web site:   [email or web address] °

## 2014-04-03 ENCOUNTER — Encounter (HOSPITAL_COMMUNITY): Payer: Self-pay | Admitting: Cardiology

## 2014-04-03 DIAGNOSIS — E119 Type 2 diabetes mellitus without complications: Secondary | ICD-10-CM

## 2014-04-03 DIAGNOSIS — G4730 Sleep apnea, unspecified: Secondary | ICD-10-CM

## 2014-04-03 DIAGNOSIS — Z794 Long term (current) use of insulin: Secondary | ICD-10-CM

## 2014-04-03 DIAGNOSIS — I10 Essential (primary) hypertension: Secondary | ICD-10-CM

## 2014-04-03 DIAGNOSIS — E785 Hyperlipidemia, unspecified: Secondary | ICD-10-CM

## 2014-04-03 DIAGNOSIS — E669 Obesity, unspecified: Secondary | ICD-10-CM

## 2014-04-03 HISTORY — DX: Hyperlipidemia, unspecified: E78.5

## 2014-04-03 HISTORY — DX: Obesity, unspecified: E66.9

## 2015-10-02 IMAGING — CT CT ABD-PELV W/ CM
3 of 5 series · 15 of 32 positions shown, 18 images · IV contrast (omnipaque)
Comparison: Prior CT abdomen/ pelvis 08/09/2013

CLINICAL DATA: Several day history of abdominal pain. Past medical
history of pancreatitis.

EXAM:
CT ABDOMEN AND PELVIS WITH CONTRAST
TECHNIQUE: Multidetector CT imaging of the abdomen and pelvis was performed
using the standard protocol following bolus administration of
intravenous contrast.
CONTRAST:  100mL OMNIPAQUE IOHEXOL 300 MG/ML  SOLN

[Series 2: abd/pelvis with · axial · 0.84mm/px · z∈[-386,-236]mm · 2 of 91 slices shown, 5 images]
[im 31/91  soft-tissue]
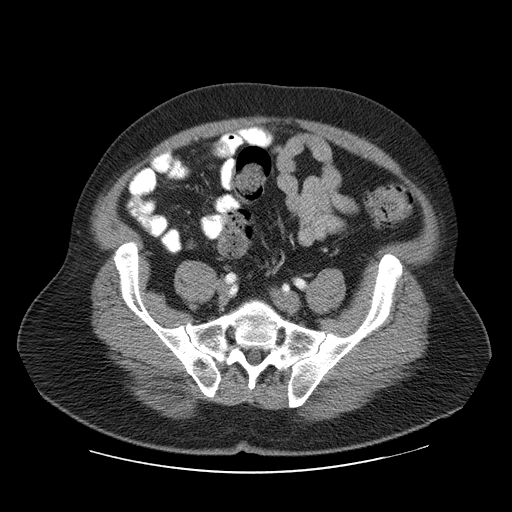
[im 31/91  lung]
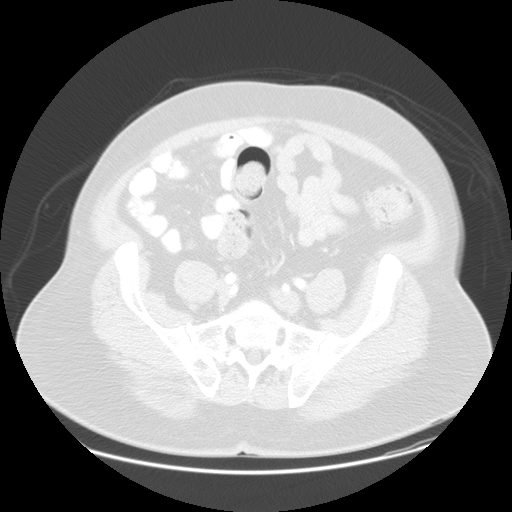
[im 31/91  bone]
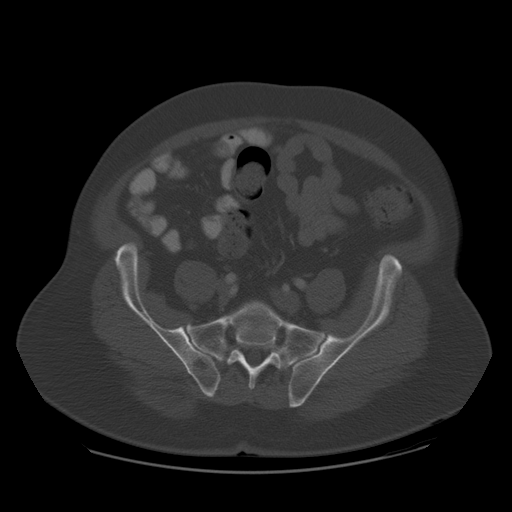
[im 61/91  soft-tissue]
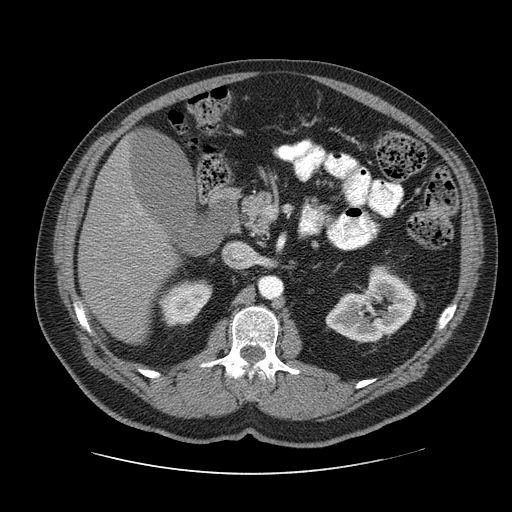
[im 61/91  lung]
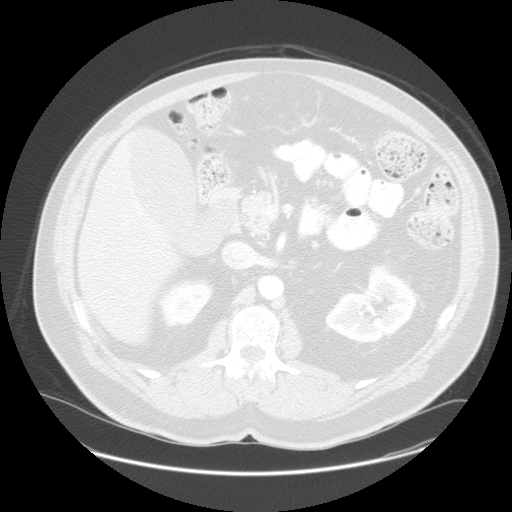

[Series 400: sag · sagittal · 0.92mm/px · 8 of 210 slices shown]
[im 20/210  soft-tissue]
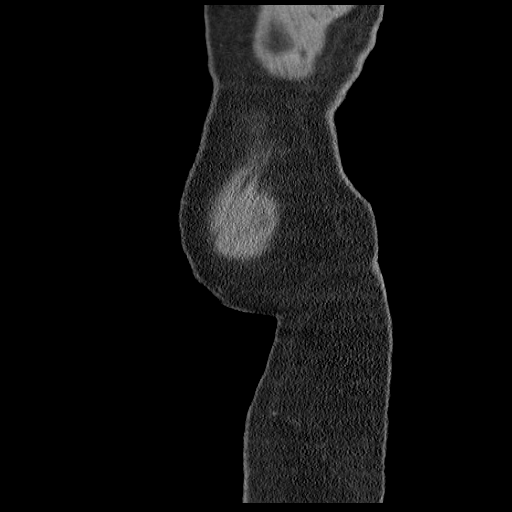
[im 39/210  soft-tissue]
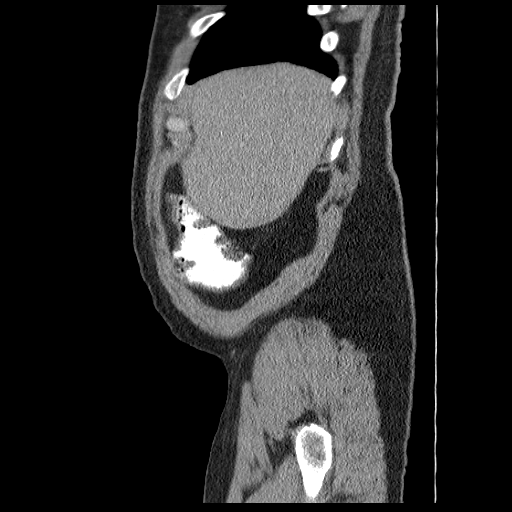
[im 77/210  soft-tissue]
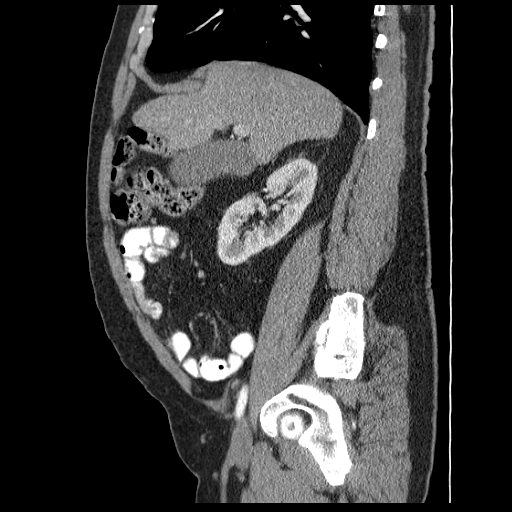
[im 96/210  soft-tissue]
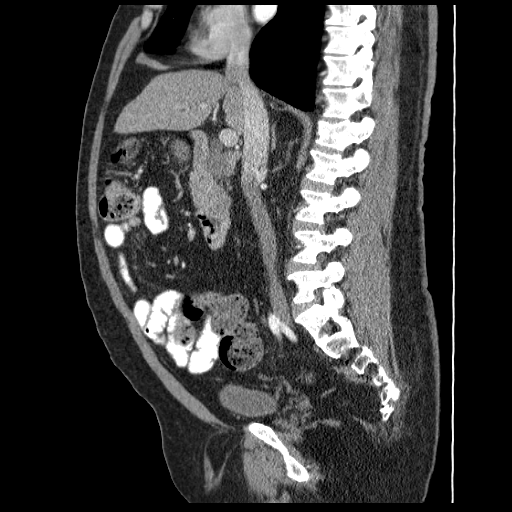
[im 115/210  soft-tissue]
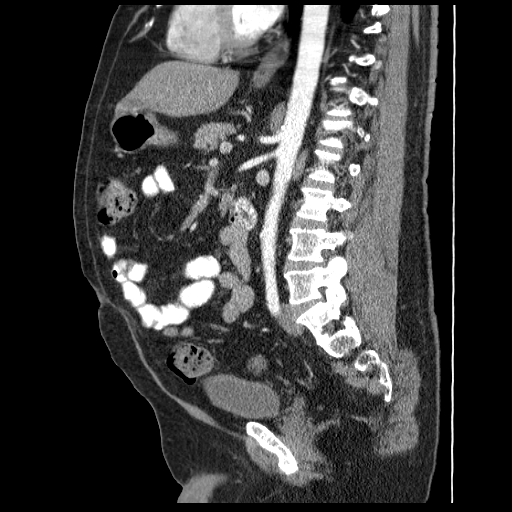
[im 134/210  soft-tissue]
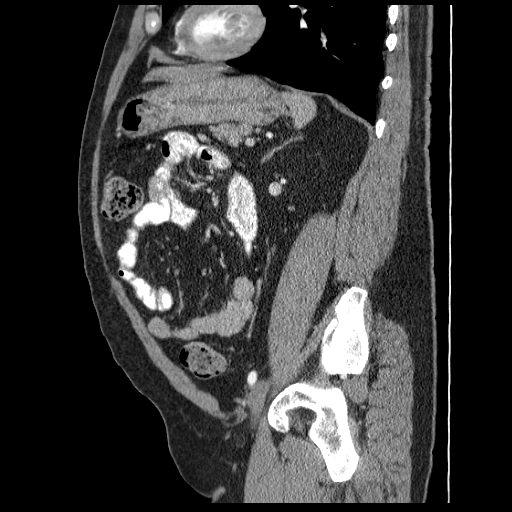
[im 172/210  soft-tissue]
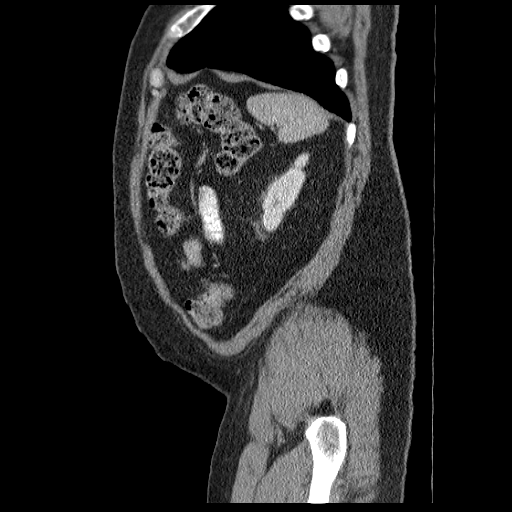
[im 191/210  soft-tissue]
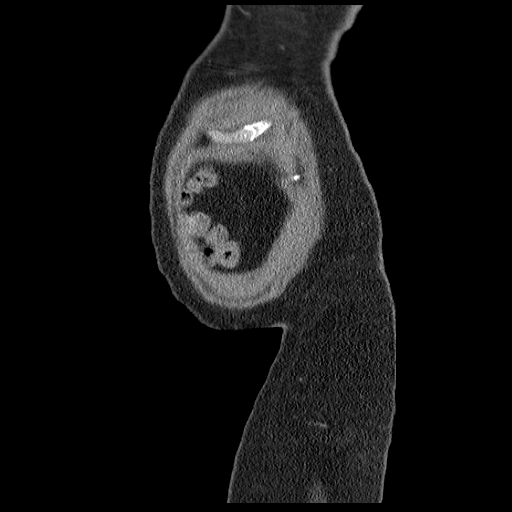

[Series 401: cor · coronal · 0.92mm/px · 5 of 204 slices shown]
[im 19/204  soft-tissue]
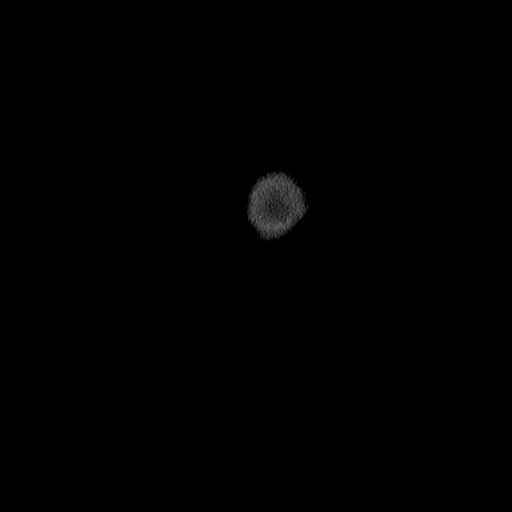
[im 37/204  soft-tissue]
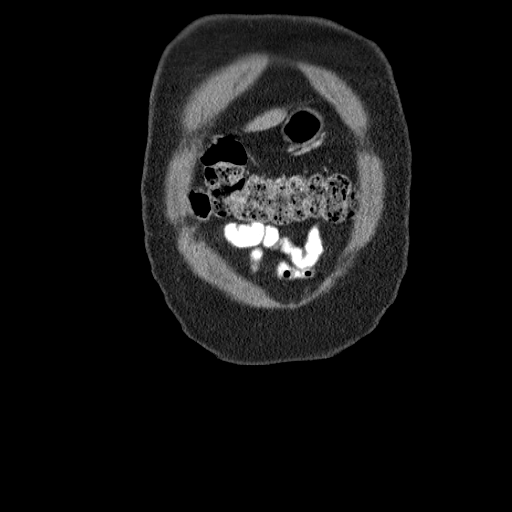
[im 74/204  soft-tissue]
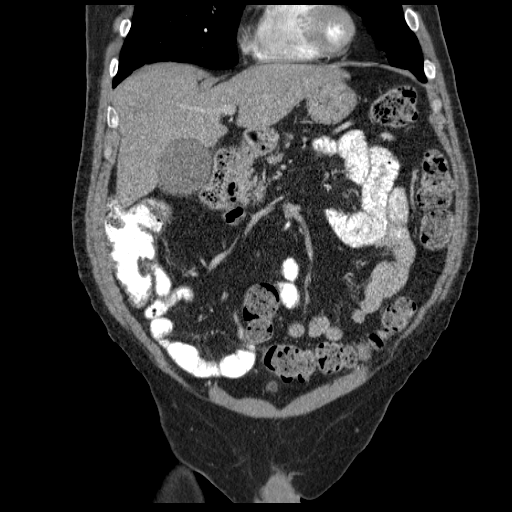
[im 93/204  soft-tissue]
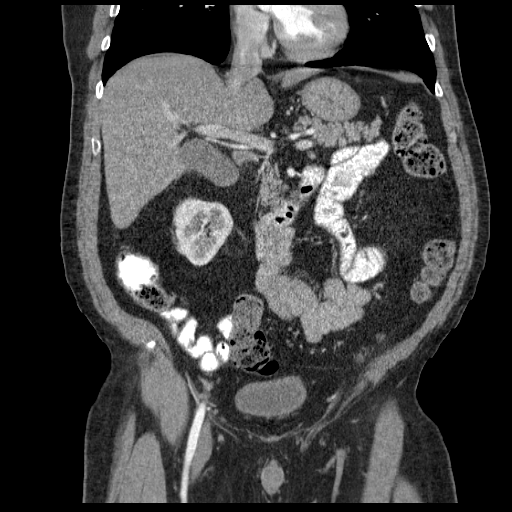
[im 111/204  soft-tissue]
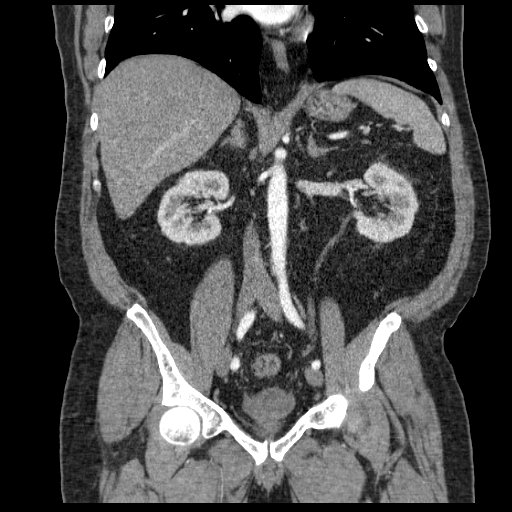

[15 of 32 positions shown; findings below may reference images not displayed]

FINDINGS: Lower Chest: 3 mm subpleural pulmonary nodules in the right lower
lobe (5 and 6 of series 3) remain unchanged dating back to Monday July, 2013. Otherwise, the visualized lower lungs are clear. Cardiac
structures within normal limits for size. Unremarkable distal
thoracic esophagus.

Abdomen: Unremarkable CT appearance of the stomach, duodenum,
spleen, adrenal glands and pancreas. Normal hepatic contour and
morphology. Diffuse hypoattenuation of the hepatic parenchyma with
slight sparing adjacent to the gallbladder fossa consistent with
hepatic steatosis. The gallbladder is mildly distended at 5.1 cm in
diameter. Suggestion of high attenuation in the gallbladder neck
raises the possibility of stones. No definite radiopaque stones are
identified. No intrahepatic biliary ductal dilatation.

Unremarkable appearance of the bilateral kidneys. No focal solid
lesion, hydronephrosis or nephrolithiasis. No evidence of
obstruction or focal bowel wall thickening. Normal appendix in the
right lower quadrant. The terminal ileum is unremarkable. Moderate
volume of formed stool throughout the colon. No free fluid or
suspicious adenopathy.

Pelvis: Unremarkable bladder, prostate gland and seminal vesicles.
No free fluid or suspicious adenopathy.

Bones/Soft Tissues: No acute fracture or aggressive appearing lytic
or blastic osseous lesion.

Vascular: No significant atherosclerotic vascular disease,
aneurysmal dilatation or acute abnormality.
IMPRESSION: 1. Nonspecific distension of the gallbladder without definite
gallbladder wall thickening or pericholecystic fluid. No radiopaque
cholelithiasis identified although there is slight increased density
in the gallbladder neck suggesting the presence of underlying
stones. If the patient's pain localizes to the right upper quadrant,
consider further evaluation with abdominal ultrasound to assess for
possible acute or chronic cholecystitis.
2. Moderate volume of retained stool throughout the colon. Query
clinical history of constipation?
3. Hepatic steatosis.
4. Stable tiny (up to 3 mm) subpleural pulmonary nodules dating back
to at least Monday July, 2013. While multiplicity and 6 month stability
is highly suggestive of a benign process, a at least 1 additional
followup CT scan is recommended to ensure stability. Recommend
repeat CT scan of the chest in 1 year.

## 2019-04-01 DEATH — deceased
